# Patient Record
Sex: Female | Born: 1950 | Race: White | Hispanic: No | Smoking: Former smoker
Health system: Southern US, Community
[De-identification: ages and names within clinical notes are randomized; demographics above are authoritative.]

## PROBLEM LIST (undated history)

## (undated) DIAGNOSIS — Z720 Tobacco use: Secondary | ICD-10-CM

## (undated) DIAGNOSIS — F329 Major depressive disorder, single episode, unspecified: Secondary | ICD-10-CM

## (undated) DIAGNOSIS — F32A Depression, unspecified: Secondary | ICD-10-CM

## (undated) DIAGNOSIS — I10 Essential (primary) hypertension: Secondary | ICD-10-CM

## (undated) DIAGNOSIS — E785 Hyperlipidemia, unspecified: Secondary | ICD-10-CM

## (undated) DIAGNOSIS — F419 Anxiety disorder, unspecified: Secondary | ICD-10-CM

## (undated) DIAGNOSIS — I2510 Atherosclerotic heart disease of native coronary artery without angina pectoris: Secondary | ICD-10-CM

---

## 2012-09-21 ENCOUNTER — Other Ambulatory Visit: Payer: Self-pay | Admitting: Family Medicine

## 2012-09-21 ENCOUNTER — Other Ambulatory Visit: Payer: Self-pay

## 2012-09-21 DIAGNOSIS — Z1231 Encounter for screening mammogram for malignant neoplasm of breast: Secondary | ICD-10-CM

## 2012-10-13 ENCOUNTER — Ambulatory Visit
Admission: RE | Admit: 2012-10-13 | Discharge: 2012-10-13 | Disposition: A | Discharge status: Home / Self Care | Payer: BC Managed Care – PPO | Source: Ambulatory Visit | Attending: Family Medicine | Admitting: Family Medicine

## 2012-10-13 DIAGNOSIS — Z1231 Encounter for screening mammogram for malignant neoplasm of breast: Secondary | ICD-10-CM

## 2012-10-16 ENCOUNTER — Other Ambulatory Visit: Payer: Self-pay | Admitting: Family Medicine

## 2012-10-16 DIAGNOSIS — R928 Other abnormal and inconclusive findings on diagnostic imaging of breast: Secondary | ICD-10-CM

## 2012-10-31 ENCOUNTER — Ambulatory Visit
Admission: RE | Admit: 2012-10-31 | Discharge: 2012-10-31 | Disposition: A | Discharge status: Home / Self Care | Payer: BC Managed Care – PPO | Source: Ambulatory Visit | Attending: Family Medicine | Admitting: Family Medicine

## 2012-10-31 DIAGNOSIS — R928 Other abnormal and inconclusive findings on diagnostic imaging of breast: Secondary | ICD-10-CM

## 2013-05-13 ENCOUNTER — Other Ambulatory Visit: Payer: Self-pay

## 2013-05-13 ENCOUNTER — Inpatient Hospital Stay (HOSPITAL_COMMUNITY)
Admission: EM | Admit: 2013-05-13 | Discharge: 2013-05-16 | DRG: 247 | Disposition: A | Discharge status: Home / Self Care | Payer: BC Managed Care – PPO | Source: Other Acute Inpatient Hospital | Attending: Interventional Cardiology | Admitting: Interventional Cardiology

## 2013-05-13 ENCOUNTER — Encounter (HOSPITAL_COMMUNITY)
Admission: EM | Disposition: A | Discharge status: Home / Self Care | Payer: BC Managed Care – PPO | Source: Other Acute Inpatient Hospital | Attending: Interventional Cardiology

## 2013-05-13 DIAGNOSIS — I959 Hypotension, unspecified: Secondary | ICD-10-CM

## 2013-05-13 DIAGNOSIS — Z955 Presence of coronary angioplasty implant and graft: Secondary | ICD-10-CM

## 2013-05-13 DIAGNOSIS — I2119 ST elevation (STEMI) myocardial infarction involving other coronary artery of inferior wall: Principal | ICD-10-CM

## 2013-05-13 DIAGNOSIS — F17210 Nicotine dependence, cigarettes, uncomplicated: Secondary | ICD-10-CM

## 2013-05-13 DIAGNOSIS — F17200 Nicotine dependence, unspecified, uncomplicated: Secondary | ICD-10-CM | POA: Diagnosis present

## 2013-05-13 DIAGNOSIS — I10 Essential (primary) hypertension: Secondary | ICD-10-CM | POA: Diagnosis present

## 2013-05-13 DIAGNOSIS — F411 Generalized anxiety disorder: Secondary | ICD-10-CM | POA: Diagnosis present

## 2013-05-13 DIAGNOSIS — F3289 Other specified depressive episodes: Secondary | ICD-10-CM | POA: Diagnosis present

## 2013-05-13 DIAGNOSIS — Z720 Tobacco use: Secondary | ICD-10-CM

## 2013-05-13 DIAGNOSIS — F329 Major depressive disorder, single episode, unspecified: Secondary | ICD-10-CM | POA: Diagnosis present

## 2013-05-13 DIAGNOSIS — I2510 Atherosclerotic heart disease of native coronary artery without angina pectoris: Secondary | ICD-10-CM

## 2013-05-13 DIAGNOSIS — E785 Hyperlipidemia, unspecified: Secondary | ICD-10-CM

## 2013-05-13 HISTORY — DX: Anxiety disorder, unspecified: F41.9

## 2013-05-13 HISTORY — DX: Tobacco use: Z72.0

## 2013-05-13 HISTORY — DX: Atherosclerotic heart disease of native coronary artery without angina pectoris: I25.10

## 2013-05-13 HISTORY — PX: PERCUTANEOUS CORONARY STENT INTERVENTION (PCI-S): SHX5485

## 2013-05-13 HISTORY — DX: Depression, unspecified: F32.A

## 2013-05-13 HISTORY — PX: LEFT HEART CATHETERIZATION WITH CORONARY ANGIOGRAM: SHX5451

## 2013-05-13 HISTORY — DX: Hyperlipidemia, unspecified: E78.5

## 2013-05-13 HISTORY — DX: Major depressive disorder, single episode, unspecified: F32.9

## 2013-05-13 HISTORY — DX: Essential (primary) hypertension: I10

## 2013-05-13 LAB — CBC WITH DIFFERENTIAL/PLATELET
BASOS PCT: 0 % (ref 0–1)
Basophils Absolute: 0 10*3/uL (ref 0.0–0.1)
EOS ABS: 0 10*3/uL (ref 0.0–0.7)
EOS PCT: 0 % (ref 0–5)
HEMATOCRIT: 37.6 % (ref 36.0–46.0)
HEMOGLOBIN: 12.8 g/dL (ref 12.0–15.0)
Lymphocytes Relative: 10 % — ABNORMAL LOW (ref 12–46)
Lymphs Abs: 1.2 10*3/uL (ref 0.7–4.0)
MCH: 32.1 pg (ref 26.0–34.0)
MCHC: 34 g/dL (ref 30.0–36.0)
MCV: 94.2 fL (ref 78.0–100.0)
MONO ABS: 1.2 10*3/uL — AB (ref 0.1–1.0)
MONOS PCT: 10 % (ref 3–12)
Neutro Abs: 10.2 10*3/uL — ABNORMAL HIGH (ref 1.7–7.7)
Neutrophils Relative %: 80 % — ABNORMAL HIGH (ref 43–77)
Platelets: 181 10*3/uL (ref 150–400)
RBC: 3.99 MIL/uL (ref 3.87–5.11)
RDW: 12.5 % (ref 11.5–15.5)
WBC: 12.6 10*3/uL — ABNORMAL HIGH (ref 4.0–10.5)

## 2013-05-13 LAB — MRSA PCR SCREENING: MRSA BY PCR: NEGATIVE

## 2013-05-13 LAB — COMPREHENSIVE METABOLIC PANEL
ALBUMIN: 3.5 g/dL (ref 3.5–5.2)
ALT: 26 U/L (ref 0–35)
AST: 50 U/L — ABNORMAL HIGH (ref 0–37)
Alkaline Phosphatase: 96 U/L (ref 39–117)
BUN: 16 mg/dL (ref 6–23)
CALCIUM: 8.5 mg/dL (ref 8.4–10.5)
CO2: 21 mEq/L (ref 19–32)
CREATININE: 0.7 mg/dL (ref 0.50–1.10)
Chloride: 108 mEq/L (ref 96–112)
GFR calc Af Amer: >90 mL/min (ref >90)
GFR calc non Af Amer: >90 mL/min (ref >90)
Glucose, Bld: 127 mg/dL — ABNORMAL HIGH (ref 70–99)
Potassium: 4 mEq/L (ref 3.7–5.3)
Sodium: 141 mEq/L (ref 137–147)
TOTAL PROTEIN: 6.3 g/dL (ref 6.0–8.3)
Total Bilirubin: 0.2 mg/dL — ABNORMAL LOW (ref 0.3–1.2)

## 2013-05-13 LAB — TSH: TSH: 0.845 u[IU]/mL (ref 0.350–4.500)

## 2013-05-13 LAB — PRO B NATRIURETIC PEPTIDE: PRO B NATRI PEPTIDE: 166.8 pg/mL — AB (ref 0–125)

## 2013-05-13 LAB — TROPONIN I: Troponin I: 7.1 ng/mL (ref <0.30)

## 2013-05-13 SURGERY — LEFT HEART CATHETERIZATION WITH CORONARY ANGIOGRAM

## 2013-05-13 MED ORDER — NITROGLYCERIN 0.4 MG SL SUBL: 0.4000 mg | SUBLINGUAL_TABLET | SUBLINGUAL | Status: DC | PRN

## 2013-05-13 MED ORDER — SODIUM CHLORIDE 0.9 % IV SOLN
INTRAVENOUS | Status: AC
  Administered 2013-05-13: 125 mL/h via INTRAVENOUS

## 2013-05-13 MED ORDER — ASPIRIN EC 81 MG PO TBEC
81.0000 mg | DELAYED_RELEASE_TABLET | Freq: Every day | ORAL | Status: DC
  Administered 2013-05-14 – 2013-05-16 (×3): 81 mg via ORAL
  Filled 2013-05-13 (×3): qty 1

## 2013-05-13 MED ORDER — TICAGRELOR 90 MG PO TABS
90.0000 mg | ORAL_TABLET | Freq: Two times a day (BID) | ORAL | Status: DC
  Administered 2013-05-14 – 2013-05-16 (×5): 90 mg via ORAL
  Filled 2013-05-13 (×6): qty 1

## 2013-05-13 MED ORDER — ONDANSETRON HCL 4 MG/2ML IJ SOLN
INTRAMUSCULAR | Status: AC
  Administered 2013-05-13: 4 mg
  Filled 2013-05-13: qty 2

## 2013-05-13 MED ORDER — SODIUM CHLORIDE 0.9 % IV SOLN: 0.2500 mg/kg/h | INTRAVENOUS | Status: AC

## 2013-05-13 MED ORDER — SODIUM CHLORIDE 0.9 % IV SOLN: 12.5000 mg | Freq: Once | INTRAVENOUS | Status: DC

## 2013-05-13 MED ORDER — DULOXETINE HCL 30 MG PO CPEP
30.0000 mg | ORAL_CAPSULE | Freq: Every day | ORAL | Status: DC
  Administered 2013-05-14 – 2013-05-16 (×3): 30 mg via ORAL
  Filled 2013-05-13 (×3): qty 1

## 2013-05-13 MED ORDER — DOPAMINE-DEXTROSE 3.2-5 MG/ML-% IV SOLN
5.0000 ug/kg/min | INTRAVENOUS | Status: DC
  Administered 2013-05-13: 5 ug/kg/min via INTRAVENOUS

## 2013-05-13 MED ORDER — ONDANSETRON HCL 4 MG/2ML IJ SOLN
4.0000 mg | Freq: Four times a day (QID) | INTRAMUSCULAR | Status: DC | PRN
  Administered 2013-05-14: 4 mg via INTRAVENOUS
  Filled 2013-05-13: qty 2

## 2013-05-13 MED ORDER — ENOXAPARIN SODIUM 40 MG/0.4ML ~~LOC~~ SOLN
40.0000 mg | SUBCUTANEOUS | Status: DC
  Administered 2013-05-14 – 2013-05-15 (×2): 40 mg via SUBCUTANEOUS
  Filled 2013-05-13 (×4): qty 0.4

## 2013-05-13 MED ORDER — PROMETHAZINE HCL 25 MG/ML IJ SOLN
12.5000 mg | Freq: Once | INTRAMUSCULAR | Status: AC
  Administered 2013-05-13: 12.5 mg via INTRAVENOUS
  Filled 2013-05-13: qty 1

## 2013-05-13 MED ORDER — SIMVASTATIN 40 MG PO TABS
40.0000 mg | ORAL_TABLET | Freq: Every day | ORAL | Status: DC
  Administered 2013-05-13 – 2013-05-15 (×3): 40 mg via ORAL
  Filled 2013-05-13 (×5): qty 1

## 2013-05-13 NOTE — H&P (Addendum)
Kristin Trujillo is a 63 y.o. female  Admit Date: 05/13/2013 Referring Physician: [facility] Primary Cardiologist:: H. Therese Sarah Leia Alf, M.D. Chief complaint / reason for admission: Inferior ST elevation MI  HPI: 63 year old female who was brought to the emergency room by the [facility] N/A his therapy stay. The complaint is the onset of severe chest pressure at 1530 o'clock. She had been raking leaves and working in the yard all weekend. The discomfort has been unremitting since its onset. EKGs from to feel demonstrated inferior ST elevation compatible with inferior STEMI. In the emergency room the patient was difficult to obtain a history from because she was so anxious and concerned that she may die. Her history and says that over the past year she has lost both her husband and son around this time last year and felt that she would leave her 2 remaining sons without a mother. She has no prior history of heart disease. She smokes cigarettes. There is no history of diabetes    PMH:   No past medical history on file.  PSH:   No past surgical history on file. ALLERGIES:   Review of patient's allergies indicates no known allergies. Prior to Admit Meds:   Prescriptions prior to admission  Medication Sig Dispense Refill  . DULoxetine (CYMBALTA) 30 MG capsule Take 30 mg by mouth daily.       Family HX:   No family history on file. Social HX:    History   Social History  . Marital Status: Single    Spouse Name: N/A    Number of Children: N/A  . Years of Education: N/A   Occupational History  . Not on file.   Social History Main Topics  . Smoking status: Not on file  . Smokeless tobacco: Not on file  . Alcohol Use: Not on file  . Drug Use: Not on file  . Sexual Activity: Not on file   Other Topics Concern  . Not on file   Social History Narrative  . No narrative on file     ROS No history of asthma. Cigarette smoker. No history of diabetes. She  denies hypercholesterolemia or therapy for hypercholesterolemia. She has been depressed and anxious since the death of her husband and a son approximately one year ago.  Physical Exam: Height 5\' 3"  (1.6 m), weight 111 lb (50.349 kg). BP 90/60 mmHg , heart rate 60, respirations 22  Patient is very anxious looking and hyperventilating. She appears older than her stated age. The neck exam reveals no thyroid enlargement, significant JVD, or carotid bruit.  The lungs are clear anteriorly and posteriorly. Cardiac exam reveals no rub, click, murmur, or gallop. The extremities reveal no edema. Radial, femoral, and posterior tibial pulses are 2+ and symmetric Abdomen is soft. Bowel sounds are normal. Neurological exam is unremarkable.  Labs: Pending   Radiology:  Pending  EKG:  Inferior ST elevation with reciprocal lateral ST segment depression  ASSESSMENT: 1. Acute inferior wall ST segment elevation myocardial infarction 2. Tobacco smoking history 3. Anxiety and depression  Plan:  1. Patient was brought immediately to the catheterization laboratory where catheterization and possible PCI will be performed. The procedure and risks of stroke, death, myocardial infarction, limb ischemia, bleeding, among other complications were discussed in detail and except above the patient in the emergency circumstances.  Complex decision making including intravenous drips, continuous monitoring of vital signs, and medication administration to control symptoms and vital signs occurred  continuously from the emergency room to the catheterization laboratory. The period of stabilization required prior to starting the procedure was greater than 35 minutes. Lyn RecordsHenry W Vianney Kopecky III 05/13/2013 7:00 PM

## 2013-05-13 NOTE — CV Procedure (Signed)
Left Heart Catheterization with Coronary Angiography and PCI Report  Kristin Trujillo  63 y.o.  female 04/24/1950  Procedure Date: 05/13/2013 Referring Physician: [facility]  Primary Cardiologist: Cherrie GauzeH. WB Leia AlfSmith, III, M.D.  INDICATIONS: Acute inferior wall ST elevation MI commencing at 15:30  PROCEDURE: 1. Left heart catheterization; 2. Coronary angiography; 3. Left ventriculography; 4. DES distal RCA  CONSENT:  The risks, benefits, and details of the procedure were explained in detail to the patient. Risks including death, stroke, heart attack, kidney injury, allergy, limb ischemia, bleeding and radiation injury were discussed.  The patient verbalized understanding and wanted to proceed.  Informed written consent was obtained.  PROCEDURE TECHNIQUE:  After Xylocaine anesthesia a 5 French Slender sheath was placed in the right radial artery with an angiocath and the modified Seldinger technique.  Coronary angiography was done using a 5 F JL 3.5 diagnostic and 6 [place]French JR 4 guide catheter.  Left ventriculography was done using the JR 4 guide catheter and hand injection.   At the time that the first images were obtained, the distal right coronary was identified to have a high-grade thrombus containing stenosis before the origin of the PDA. TIMI grade 3 flow was present. The patient had ongoing chest discomfort. The right coronary images for the first images obtained. Because the vessel was open and she had ongoing chest pain we decided to image the left coronary before proceeding with PCI on what we felt was the culprit lesion. The left coronary system revealed diffuse irregularity but no high-grade obstruction. Hand injection was performed in the left ventricle use of the guide catheter and demonstrated mild basal hypokinesis inferiorly.  Using a JR4,  6 [place]French guide catheter, we performed PCI using a 0.014 Pro-water wire and a 2.0 x 12 mm long Emerge balloon to predilate. We then  positioned and deployed a 2.25 x 18 mm long Xience Alpine to 14 atmospheres. Post-dilatation was performed using a 2.5 x 12 mm Eldridge Euphora. 3 balloon inflations to 12 atmospheres were performed.  The patient was loaded with Brilinta 180 mg orally. Angiomax bolus and infusion was also started prior to PCI. A CT was documented greater than 300.  At completion of the case, a wrist band was applied at 12 cc with good hemostasis   CONTRAST:  Total of 175 cc.  COMPLICATIONS:  Significant hypotension requiring IV dopamine and saline infusion.   HEMODYNAMICS:  Aortic pressure 94/42 mm mercury; LV pressure 100/17 mmHg; LVEDP 20 mm mercury  ANGIOGRAPHIC DATA:   The left main coronary artery is patent.  The left anterior descending artery is transapical. Multiple luminal irregularities are noted. A large diagonal arises from the mid vessel. Significant vasoconstriction is noted..  The left circumflex artery is patent. It gives origin to 2 obtuse marginal branches. Diffuse luminal irregularities are noted..  The right coronary artery is moderately to severely diffusely disease starting proximally. The mid vessel contains segmental eccentric 50-60% narrowing. After a large distal acute marginal branch the RCA contains 99% stenosis, with superimposed thrombus, before the origin of the PDA. The PDA and 2 small left ventricular branches are widely patent.Marland Kitchen.   PCI RESULTS: Distal RCA 99% thrombus containing lesion reduced to 0% with TIMI grade 3 flow after angioplasty, followed by stenting using a 2.25 x 18 mm Xiance Alpine DES, post dilated with a 12 mm x 2.5 Kittrell Euphora. 3 balloon inflations were performed.   LEFT VENTRICULOGRAM:  Left ventricular angiogram was done in the 30  RAO projection and revealed overall normal to hyperdynamic contractility with an estimated ejection fraction of 65-70%. There was basal mild inferior hypokinesis per   IMPRESSIONS:  1. Acute inferior ST elevation myocardial infarction  with spontaneous reperfusion in the distal right coronary by the time the first angiographic image was obtained.  2. Successful angioplasty and stenting of the distal RCA with a 2.25 x 18 mm Alpine drug-eluting stent postdilated to 2.5 mm. The right coronary contains rather diffuse irregularities from proximal to distal vessel.  3. Widely patent LAD, left main, and circumflex arteries but with diffuse luminal irregularities .  4. Overall normal LV function with inferobasal hypokinesis.   RECOMMENDATION:   Aspirin and Brilinta for one year Bivalirudin infusion for 6 hours Wean and DC dopamine as tolerated by blood pressure which must be maintained greater than 90 mm mercury systolic ACE inhibitor therapy. Possible beta blocker therapy if blood pressure allows but at this time patient cannot receive any medication that would tend to lower her blood pressure. Phase I into cardiac rehabilitation The patient will be a candidate for discharge around 48 hours from  Completion of this procedure. Her myocardial infarction should be small.  Marland Kitchen

## 2013-05-14 ENCOUNTER — Encounter (HOSPITAL_COMMUNITY): Payer: Self-pay

## 2013-05-14 DIAGNOSIS — I369 Nonrheumatic tricuspid valve disorder, unspecified: Secondary | ICD-10-CM

## 2013-05-14 DIAGNOSIS — F17210 Nicotine dependence, cigarettes, uncomplicated: Secondary | ICD-10-CM

## 2013-05-14 DIAGNOSIS — E785 Hyperlipidemia, unspecified: Secondary | ICD-10-CM

## 2013-05-14 DIAGNOSIS — Z720 Tobacco use: Secondary | ICD-10-CM

## 2013-05-14 DIAGNOSIS — I2119 ST elevation (STEMI) myocardial infarction involving other coronary artery of inferior wall: Secondary | ICD-10-CM

## 2013-05-14 DIAGNOSIS — F17200 Nicotine dependence, unspecified, uncomplicated: Secondary | ICD-10-CM

## 2013-05-14 LAB — BASIC METABOLIC PANEL
BUN: 13 mg/dL (ref 6–23)
CALCIUM: 8.9 mg/dL (ref 8.4–10.5)
CO2: 22 meq/L (ref 19–32)
CREATININE: 0.63 mg/dL (ref 0.50–1.10)
Chloride: 106 mEq/L (ref 96–112)
GFR calc Af Amer: >90 mL/min (ref >90)
GFR calc non Af Amer: >90 mL/min (ref >90)
Glucose, Bld: 128 mg/dL — ABNORMAL HIGH (ref 70–99)
Potassium: 4 mEq/L (ref 3.7–5.3)
Sodium: 143 mEq/L (ref 137–147)

## 2013-05-14 LAB — CBC
HCT: 37.3 % (ref 36.0–46.0)
Hemoglobin: 12.7 g/dL (ref 12.0–15.0)
MCH: 31.8 pg (ref 26.0–34.0)
MCHC: 34 g/dL (ref 30.0–36.0)
MCV: 93.5 fL (ref 78.0–100.0)
PLATELETS: 171 10*3/uL (ref 150–400)
RBC: 3.99 MIL/uL (ref 3.87–5.11)
RDW: 12.4 % (ref 11.5–15.5)
WBC: 9.7 10*3/uL (ref 4.0–10.5)

## 2013-05-14 LAB — POCT I-STAT, CHEM 8
BUN: 16 mg/dL (ref 6–23)
CHLORIDE: 108 meq/L (ref 96–112)
CREATININE: 0.7 mg/dL (ref 0.50–1.10)
Calcium, Ion: 1.2 mmol/L (ref 1.13–1.30)
GLUCOSE: 156 mg/dL — AB (ref 70–99)
HCT: 34 % — ABNORMAL LOW (ref 36.0–46.0)
HEMOGLOBIN: 11.6 g/dL — AB (ref 12.0–15.0)
Potassium: 3.3 mEq/L — ABNORMAL LOW (ref 3.7–5.3)
SODIUM: 141 meq/L (ref 137–147)
TCO2: 23 mmol/L (ref 0–100)

## 2013-05-14 LAB — TROPONIN I
Troponin I: 19.2 ng/mL (ref <0.30)
Troponin I: >20 ng/mL (ref <0.30)

## 2013-05-14 LAB — LIPID PANEL
CHOLESTEROL: 226 mg/dL — AB (ref 0–200)
HDL: 53 mg/dL (ref >39)
LDL Cholesterol: 160 mg/dL — ABNORMAL HIGH (ref 0–99)
TRIGLYCERIDES: 67 mg/dL (ref <150)
Total CHOL/HDL Ratio: 4.3 RATIO
VLDL: 13 mg/dL (ref 0–40)

## 2013-05-14 LAB — HEMOGLOBIN A1C
Hgb A1c MFr Bld: 5.5 % (ref <5.7)
Mean Plasma Glucose: 111 mg/dL (ref <117)

## 2013-05-14 LAB — POCT ACTIVATED CLOTTING TIME: Activated Clotting Time: 343 seconds

## 2013-05-14 LAB — T4, FREE: FREE T4: 1 ng/dL (ref 0.80–1.80)

## 2013-05-14 MED ORDER — LISINOPRIL 2.5 MG PO TABS
2.5000 mg | ORAL_TABLET | Freq: Every day | ORAL | Status: DC
  Administered 2013-05-16: 2.5 mg via ORAL
  Filled 2013-05-14 (×3): qty 1

## 2013-05-14 MED ORDER — METOPROLOL TARTRATE 12.5 MG HALF TABLET
12.5000 mg | ORAL_TABLET | Freq: Two times a day (BID) | ORAL | Status: DC
  Administered 2013-05-14 – 2013-05-16 (×3): 12.5 mg via ORAL
  Filled 2013-05-14 (×6): qty 1

## 2013-05-14 MED FILL — Ticagrelor Tab 90 MG: ORAL | Qty: 1 | Status: AC

## 2013-05-14 MED FILL — Fentanyl Citrate Inj 0.05 MG/ML: INTRAMUSCULAR | Qty: 2 | Status: AC

## 2013-05-14 MED FILL — Midazolam HCl Inj 2 MG/2ML (Base Equivalent): INTRAMUSCULAR | Qty: 2 | Status: AC

## 2013-05-14 MED FILL — Bivalirudin Trifluoroacetate For IV Soln 250 MG (Base Equiv): INTRAVENOUS | Qty: 250 | Status: AC

## 2013-05-14 MED FILL — Lidocaine HCl Local Preservative Free (PF) Inj 1%: INTRAMUSCULAR | Qty: 30 | Status: AC

## 2013-05-14 MED FILL — Verapamil HCl IV Soln 2.5 MG/ML: INTRAVENOUS | Qty: 2 | Status: AC

## 2013-05-14 MED FILL — Ondansetron HCl Inj 4 MG/2ML (2 MG/ML): INTRAMUSCULAR | Qty: 2 | Status: AC

## 2013-05-14 MED FILL — Sodium Chloride IV Soln 0.9%: INTRAVENOUS | Qty: 50 | Status: AC

## 2013-05-14 MED FILL — Dopamine in Dextrose 5% Inj 3.2 MG/ML: INTRAVENOUS | Qty: 250 | Status: AC

## 2013-05-14 MED FILL — Nitroglycerin IV Soln 200 MCG/ML in D5W: INTRAVENOUS | Qty: 1 | Status: AC

## 2013-05-14 MED FILL — Heparin Sodium (Porcine) 2 Unit/ML in Sodium Chloride 0.9%: INTRAMUSCULAR | Qty: 1500 | Status: AC

## 2013-05-14 NOTE — Progress Notes (Signed)
  Echocardiogram 2D Echocardiogram has been performed.  Kristin Trujillo 05/14/2013, 2:46 PM

## 2013-05-14 NOTE — Progress Notes (Signed)
CARDIAC REHAB PHASE I   PRE:  Rate/Rhythm: 72 SR    BP: sitting 78/49, standing 66/46 then 81/55    SaO2:   MODE:  Ambulation: 350 ft   POST:  Rate/Rhythm: 89 SR    BP: sitting 73/51     SaO2:   Pt BP running about 78 systolic. RN turned dopamine back on and I discussed MI ed for a few minutes. BP still low after 10 min. Pt asymptomatic, even with standing and walking. No c/o, feels good. Pt tearful when discussing her family and this event. Sts she doesn't like to take meds. Discussed importance of Brilinta. Will f/u for more ed. [id number]   Kristin Trujillo CES, ACSM 05/14/2013 10:41 AM

## 2013-05-14 NOTE — Progress Notes (Signed)
   Subjective:  Day 1 s/p Inferior STEMI; no further chest pain  Objective:   Vital Signs in the last 24 hours: Temp:  [97.6 F (36.4 C)-97.9 F (36.6 C)] 97.9 F (36.6 C) (04/13 0400) Pulse Rate:  [63-82] 75 (04/13 0400) Resp:  [9-22] 9 (04/13 0400) BP: (78-140)/(48-71) 109/56 mmHg (04/13 0400) SpO2:  [92 %-98 %] 98 % (04/13 0400) FiO2 (%):  [28 %] 28 % (04/12 1948) Weight:  [111 lb (50.349 kg)] 111 lb (50.349 kg) (04/12 1700)  Intake/Output from previous day: 04/12 0701 - 04/13 0700 In: 1306.2 [P.O.:240; I.V.:1066.2] Out: 2775 [Urine:2775]  Medications: . aspirin EC  81 mg Oral Daily  . DULoxetine  30 mg Oral Daily  . enoxaparin (LOVENOX) injection  40 mg Subcutaneous Q24H  . simvastatin  40 mg Oral q1800  . Ticagrelor  90 mg Oral BID    . DOPamine 5 mcg/kg/min (05/13/13 1948)    Physical Exam:   General appearance: alert, cooperative and appears older than stated age Neck: no adenopathy, no carotid bruit, no JVD, supple, symmetrical, trachea midline and thyroid not enlarged, symmetric, no tenderness/mass/nodules Lungs: decreased BS no wheezing Heart: regular rate and rhythm and 1/6 sem Abdomen: soft, non-tender; bowel sounds normal; no masses,  no organomegaly Extremities: no edema, redness or tenderness in the calves or thighs Pulses: 2+ and symmetric; R radial site stable Skin: Skin color, texture, turgor normal. No rashes or lesions Neurologic: Grossly normal   Rate: 88  Rhythm: normal sinus rhythm  ECG: Probable low atrial rhythm at 75, early transition   Lab Results:     Recent Labs  05/13/13 2050 05/14/13 0120  NA 141 143  K 4.0 4.0  CL 108 106  CO2 21 22  GLUCOSE 127* 128*  BUN 16 13  CREATININE 0.70 0.63    Recent Labs  05/13/13 2050 05/14/13 0120  TROPONINI 7.10* 19.20*   Hepatic Function Panel  Recent Labs  05/13/13 2050  PROT 6.3  ALBUMIN 3.5  AST 50*  ALT 26  ALKPHOS 96  BILITOT 0.2*   No results found for this  basename: INR,  in the last 72 hours BNP (last 3 results)  Recent Labs  05/13/13 2050  PROBNP 166.8*    Lipid Panel     Component Value Date/Time   CHOL 226* 05/14/2013 0120   TRIG 67 05/14/2013 0120   HDL 53 05/14/2013 0120   CHOLHDL 4.3 05/14/2013 0120   VLDL 13 05/14/2013 0120   LDLCALC 160* 05/14/2013 0120      Imaging:  No results found.    Assessment/Plan:   Active Problems:   ST elevation myocardial infarction (STEMI) of inferior wall   Hyperlipidemia LDL goal < 70   Tobacco abuse  Day 1 s/p IMI and PCI to subtotal RCA stenosis with DES stent. Trop increased to 19.2.  Will check echo today. Off dopamine with BP 110 systolic.  Add low dose cardioselective BB and ACE-I. Now on statin; was not on PTA. Discussed smoking cessation. Will ambulate this am and if stable transfer to telemetry later today with DC next 24 - 48 hrs depending on status.     Lennette Biharihomas A. Mckenlee Mangham, MD, [facility]villeFACC 05/14/2013, 8:18 AM

## 2013-05-14 NOTE — Care Management Note (Addendum)
    Page 1 of 1   05/14/2013     11:27:26 AM   CARE MANAGEMENT NOTE 05/14/2013  Patient:  Kristin Trujillo,Kristin Trujillo   Account Number:  [id number]  Date Initiated:  05/14/2013  Documentation initiated by:  Junius CreamerWELL,Kristin  Subjective/Objective Assessment:   adm w mi     Action/Plan:   lives alone   Anticipated DC Date:     Anticipated DC Plan:        DC Planning Services  CM consult  Medication Assistance      Choice offered to / List presented to:             Status of service:   Medicare Important Message given?   (If response is "NO", the following Medicare IM given date fields will be blank) Date Medicare IM given:   Date Additional Medicare IM given:    Discharge Disposition:    Per UR Regulation:  Reviewed for med. necessity/level of care/duration of stay  If discussed at Long Length of Stay Meetings, dates discussed:    Comments:  4/13 1121 Kristin Khyson Sebesta rn,bsn gave pt 30day free and copay assist card for brilinta. pt must meet out of pocket amt then pays 20% of med. out of pocket is 2,000.00.

## 2013-05-15 DIAGNOSIS — I959 Hypotension, unspecified: Secondary | ICD-10-CM

## 2013-05-15 NOTE — Progress Notes (Signed)
CARDIAC REHAB PHASE I   PRE:  Rate/Rhythm: 67 SR  BP:  Supine:   Sitting: 110/61  Standing:    SaO2:   MODE:  Ambulation: 700 ft   POST:  Rate/Rhythm: 80 SR  BP:  Supine:   Sitting: 108/57  Standing:    SaO2:  0945-1034 Pt walked 700 ft with minimal asst without CP. Tolerated well. BP higher today. MI education completed today. Pt voiced understanding. Discussed smoking cessation and pt has handouts. Encouraged her to call 1800quitnow as needed. Pt stated that she has been under a great deal of stress with the deaths of her son and husband to cancer. She said she felt that she used the smoking to help with stress. Not sure pt will be able to quit. Stated doing well without nicotine patches now. Has brilinta packet and stent card. Reviewed importance of brilinta and how to take NTG if needed. Discussed CRP 2 and pt [facility]. Pt tearful during ed as she talks about how hard it is to live alone.   Luetta Nuttingharlene Angell Honse, RN BSN  05/15/2013 10:29 AM

## 2013-05-15 NOTE — Progress Notes (Addendum)
Subjective:  No recurrent chest pain; became hypotensive yesterday and stayed in unit.  Objective:   Vital Signs in the last 24 hours: Temp:  [97.9 F (36.6 C)-98.9 F (37.2 C)] 97.9 F (36.6 C) (04/14 0731) Resp:  [11-17] 17 (04/13 1600) BP: (77-106)/(39-71) 88/56 mmHg (04/14 0731) SpO2:  [99 %-100 %] 100 % (04/14 0731)  Intake/Output from previous day: 04/13 0701 - 04/14 0700 In: 720 [P.O.:720] Out: -   Medications: . aspirin EC  81 mg Oral Daily  . DULoxetine  30 mg Oral Daily  . enoxaparin (LOVENOX) injection  40 mg Subcutaneous Q24H  . lisinopril  2.5 mg Oral Daily  . metoprolol tartrate  12.5 mg Oral BID  . simvastatin  40 mg Oral q1800  . Ticagrelor  90 mg Oral BID    . DOPamine 5 mcg/kg/min (05/13/13 1948)    Physical Exam:   General appearance: appears older than stated age and no distress Neck: no JVD, supple, symmetrical, trachea midline and thyroid not enlarged, symmetric, no tenderness/mass/nodules Lungs: clear to auscultation bilaterally Heart: regular rate and rhythm and 1/6 sem Abdomen: soft, non-tender; bowel sounds normal; no masses,  no organomegaly Extremities: no edema, redness or tenderness in the calves or thighs Pulses: 2+ and symmetric Skin: Skin color, texture, turgor normal. No rashes or lesions Neurologic: Grossly normal   Rate: 69  Rhythm: normal sinus rhythm  Lab Results:    Recent Labs  05/13/13 2050 05/14/13 0120  NA 141 143  K 4.0 4.0  CL 108 106  CO2 21 22  GLUCOSE 127* 128*  BUN 16 13  CREATININE 0.70 0.63   CBC    Component Value Date/Time   WBC 9.7 05/14/2013 0120   RBC 3.99 05/14/2013 0120   HGB 12.7 05/14/2013 0120   HCT 37.3 05/14/2013 0120   PLT 171 05/14/2013 0120   MCV 93.5 05/14/2013 0120   MCH 31.8 05/14/2013 0120   MCHC 34.0 05/14/2013 0120   RDW 12.4 05/14/2013 0120   LYMPHSABS 1.2 05/13/2013 2050   MONOABS 1.2* 05/13/2013 2050   EOSABS 0.0 05/13/2013 2050   BASOSABS 0.0 05/13/2013 2050     Recent  Labs  05/14/13 0120 05/14/13 0912  TROPONINI 19.20* >20.00*   Hepatic Function Panel  Recent Labs  05/13/13 2050  PROT 6.3  ALBUMIN 3.5  AST 50*  ALT 26  ALKPHOS 96  BILITOT 0.2*   No results found for this basename: INR,  in the last 72 hours BNP (last 3 results)  Recent Labs  05/13/13 2050  PROBNP 166.8*    Lipid Panel     Component Value Date/Time   CHOL 226* 05/14/2013 0120   TRIG 67 05/14/2013 0120   HDL 53 05/14/2013 0120   CHOLHDL 4.3 05/14/2013 0120   VLDL 13 05/14/2013 0120   LDLCALC 160* 05/14/2013 0120      Imaging:  Study Conclusions  - Left ventricle: The cavity size was normal. Wall thickness was normal. Systolic function was normal. The estimated ejection fraction was in the range of 60% to 65%. Wall motion was normal; there were no regional wall motion abnormalities. Left ventricular diastolic function parameters were normal. - Left atrium: The atrium was normal in size. - Tricuspid valve: Mild regurgitation. - Pulmonary arteries: PA peak pressure: 30mm Hg (S). - Inferior vena cava: The vessel was normal in size; the respirophasic diameter changes were in the normal range (= 50%); findings are consistent with normal central venous pressure. - Pericardium, extracardiac: There  was no pericardial effusion.    Assessment/Plan:   Active Problems:   ST elevation myocardial infarction (STEMI) of inferior wall   Hyperlipidemia LDL goal < 70   Tobacco abuse  Day 2 s/p IMI. Hypotensive yesterday. Now off dopamine. Trop > 20.  AST mildly increased may be secondary to MI rather than statin. LDL 160; on simvastatin 40 mg. Transfer to telemetry. Ambulate; plan dc tomorrow. Continue low dose BB and ACE-I if BP allows.  F/U ECG. Smoking cessation.    Lennette Biharihomas A. Zana Biancardi, MD, [facility] InstituteFACC 05/15/2013, 8:03 AM

## 2013-05-16 ENCOUNTER — Encounter (HOSPITAL_COMMUNITY): Payer: Self-pay | Admitting: Physician Assistant

## 2013-05-16 DIAGNOSIS — I10 Essential (primary) hypertension: Secondary | ICD-10-CM | POA: Diagnosis present

## 2013-05-16 DIAGNOSIS — I2510 Atherosclerotic heart disease of native coronary artery without angina pectoris: Secondary | ICD-10-CM | POA: Diagnosis present

## 2013-05-16 MED ORDER — ASPIRIN 81 MG PO TBEC
81.0000 mg | DELAYED_RELEASE_TABLET | Freq: Every day | ORAL | Status: AC
Start: 2013-05-16 — End: ?

## 2013-05-16 MED ORDER — TICAGRELOR 90 MG PO TABS: 90.0000 mg | ORAL_TABLET | Freq: Two times a day (BID) | ORAL | Status: DC

## 2013-05-16 MED ORDER — NITROGLYCERIN 0.4 MG SL SUBL: 0.4000 mg | SUBLINGUAL_TABLET | SUBLINGUAL | Status: DC | PRN

## 2013-05-16 MED ORDER — LISINOPRIL 2.5 MG PO TABS: 2.5000 mg | ORAL_TABLET | Freq: Every day | ORAL | Status: DC

## 2013-05-16 MED ORDER — SIMVASTATIN 40 MG PO TABS: 40.0000 mg | ORAL_TABLET | Freq: Every day | ORAL | Status: DC

## 2013-05-16 MED ORDER — METOPROLOL TARTRATE 12.5 MG HALF TABLET: 12.5000 mg | ORAL_TABLET | Freq: Two times a day (BID) | ORAL | Status: DC

## 2013-05-16 NOTE — Discharge Summary (Signed)
Discharge Summary   Patient ID: Kristin OliveLydia A Daubenspeck MRN: [id number], DOB/AGE: 07/09/1950 63 y.o. Admit date: 05/13/2013 D/C date:     05/16/2013  Primary Cardiologist: Dr. Ival BibleSmtih  Active Problems:   ST elevation myocardial infarction (STEMI) of inferior wall   Hyperlipidemia LDL goal < 70   Tobacco abuse   HTN (hypertension)   CAD (coronary artery disease)   Admission Dates: 05/13/13-05/16/13 Discharge Diagnosis: acute inferior STEMI s/p DES to RCA (peak troponin >20)  HPI: Kristin Trujillo is a 63 y.o. female with a history of HLD, HTN, continued tobacco abuse and no past cardiac disease who [facility] with severe chest pressure and was found to have an acute inferior STEMI. She was taken for emergent cardiac catheterization.   Hospital Course: Cardiac catheterization revealed acute inferior STEMI with spontaneous reperfusion in the distal right coronary by the time the first angiographic image was obtained. She underwent successful angioplasty and stenting of the distal RCA with a 2.25 x 18 mm Alpine drug-eluting stent postdilated to 2.5 mm. The right coronary contains rather diffuse irregularities from proximal to distal vessel. She had a widely patent LAD, left main, and circumflex arteries but with diffuse luminal irregularities. She had overall normal LV function with inferobasal hypokinesis. She had some issues with hypotension and was placed on a dopamine gtt, which was eventually tapered off. When her BP stabilized a low dose of BB and ACE were added. She was placed on metoprolol 12.5mg  BID and lisinopril 2.5mg . Simvastatin 40 mg was also added. Her AST was mildly elevated but thought to be secondary to MI rather than statin.  The patient has had an uncomplicated hospital course and is recovering well. The radial catheter site is stable. She has been seen by Dr. Eden EmmsNishan today and deemed ready for discharge home. All follow-up appointments have been scheduled. Smoking cessation was  disscussed in length. A 30 day free supply of Brilinta was provided for the patient. A work excuse note was provided as well. Discharge medications include ASA/Brilinta, lisinopril 2.5mg , metoprolol 12.5 mg, SL NTG, simvastatin 40mg .    Discharge Vitals: Blood pressure 110/70, pulse 62, temperature 98.2 F (36.8 C), temperature source Oral, resp. rate 18, height 5\' 3"  (1.6 m), weight 111 lb 14.1 oz (50.749 kg), SpO2 97.00%.  Labs: Lab Results  Component Value Date   WBC 9.7 05/14/2013   HGB 12.7 05/14/2013   HCT 37.3 05/14/2013   MCV 93.5 05/14/2013   PLT 171 05/14/2013     Recent Labs Lab 05/13/13 2050 05/14/13 0120  NA 141 143  K 4.0 4.0  CL 108 106  CO2 21 22  BUN 16 13  CREATININE 0.70 0.63  CALCIUM 8.5 8.9  PROT 6.3  --   BILITOT 0.2*  --   ALKPHOS 96  --   ALT 26  --   AST 50*  --   GLUCOSE 127* 128*    Recent Labs  05/13/13 2050 05/14/13 0120 05/14/13 0912  TROPONINI 7.10* 19.20* >20.00*   Lab Results  Component Value Date   CHOL 226* 05/14/2013   HDL 53 05/14/2013   LDLCALC 160* 05/14/2013   TRIG 67 05/14/2013    Diagnostic Studies/Procedures   Left Heart Catheterization with Coronary Angiography and PCI Report    Procedure Date: 05/13/2013  [facility]  Primary Cardiologist: Cherrie GauzeH. WB Leia AlfSmith, III, M.D.  INDICATIONS: Acute inferior wall ST elevation MI commencing at 15:30  PROCEDURE: 1. Left heart catheterization; 2.  Coronary angiography; 3. Left ventriculography; 4. DES distal RCA  CONSENT:  The risks, benefits, and details of the procedure were explained in detail to the patient. Risks including death, stroke, heart attack, kidney injury, allergy, limb ischemia, bleeding and radiation injury were discussed. The patient verbalized understanding and wanted to proceed. Informed written consent was obtained.  PROCEDURE TECHNIQUE: After Xylocaine anesthesia a 5 French Slender sheath was placed in the right radial artery with an angiocath  and the modified Seldinger technique. Coronary angiography was done using a 5 F JL 3.5 diagnostic and 6 [place] JR 4 guide catheter. Left ventriculography was done using the JR 4 guide catheter and hand injection.  At the time that the first images were obtained, the distal right coronary was identified to have a high-grade thrombus containing stenosis before the origin of the PDA. TIMI grade 3 flow was present. The patient had ongoing chest discomfort. The right coronary images for the first images obtained. Because the vessel was open and she had ongoing chest pain we decided to image the left coronary before proceeding with PCI on what we felt was the culprit lesion. The left coronary system revealed diffuse irregularity but no high-grade obstruction. Hand injection was performed in the left ventricle use of the guide catheter and demonstrated mild basal hypokinesis inferiorly.  Using a JR4, 6 [place] guide catheter, we performed PCI using a 0.014 Pro-water wire and a 2.0 x 12 mm long Emerge balloon to predilate. We then positioned and deployed a 2.25 x 18 mm long Xience Alpine to 14 atmospheres. Post-dilatation was performed using a 2.5 x 12 mm [place] Euphora. 3 balloon inflations to 12 atmospheres were performed.  The patient was loaded with Brilinta 180 mg orally. Angiomax bolus and infusion was also started prior to PCI. A CT was documented greater than 300.  At completion of the case, a wrist band was applied at 12 cc with good hemostasis  CONTRAST: Total of 175 cc.  COMPLICATIONS: Significant hypotension requiring IV dopamine and saline infusion.  HEMODYNAMICS: Aortic pressure 94/42 mm mercury; LV pressure 100/17 mmHg; LVEDP 20 mm mercury  ANGIOGRAPHIC DATA: The left main coronary artery is patent.  The left anterior descending artery is transapical. Multiple luminal irregularities are noted. A large diagonal arises from the mid vessel. Significant vasoconstriction is noted..  The left circumflex artery  is patent. It gives origin to 2 obtuse marginal branches. Diffuse luminal irregularities are noted..  The right coronary artery is moderately to severely diffusely disease starting proximally. The mid vessel contains segmental eccentric 50-60% narrowing. After a large distal acute marginal branch the RCA contains 99% stenosis, with superimposed thrombus, before the origin of the PDA. The PDA and 2 small left ventricular branches are widely patent.Marland Kitchen  PCI RESULTS: Distal RCA 99% thrombus containing lesion reduced to 0% with TIMI grade 3 flow after angioplasty, followed by stenting using a 2.25 x 18 mm Xiance Alpine DES, post dilated with a 12 mm x 2.5  Euphora. 3 balloon inflations were performed.  LEFT VENTRICULOGRAM: Left ventricular angiogram was done in the 30 RAO projection and revealed overall normal to hyperdynamic contractility with an estimated ejection fraction of 65-70%. There was basal mild inferior hypokinesis per  IMPRESSIONS: 1. Acute inferior ST elevation myocardial infarction with spontaneous reperfusion in the distal right coronary by the time the first angiographic image was obtained.  2. Successful angioplasty and stenting of the distal RCA with a 2.25 x 18 mm Alpine drug-eluting stent postdilated to 2.5 mm.  The right coronary contains rather diffuse irregularities from proximal to distal vessel.  3. Widely patent LAD, left main, and circumflex arteries but with diffuse luminal irregularities .  4. Overall normal LV function with inferobasal hypokinesis.  RECOMMENDATION:  Aspirin and Brilinta for one year  Bivalirudin infusion for 6 hours  Wean and DC dopamine as tolerated by blood pressure which must be maintained greater than 90 mm mercury systolic  ACE inhibitor therapy.  Possible beta blocker therapy if blood pressure allows but at this time patient cannot receive any medication that would tend to lower her blood pressure.  Phase I into cardiac rehabilitation  The patient will  be a candidate for discharge around 48 hours from Completion of this procedure. Her myocardial infarction should be small.  2D ECHO 05/14/13 Study Conclusions - Left ventricle: The cavity size was normal. Wall thickness was normal. Systolic function was normal. The estimated ejection fraction was in the range of 60% to 65%. Wall motion was normal; there were no regional wall motion abnormalities. Left ventricular diastolic function parameters were normal. - Left atrium: The atrium was normal in size. - Tricuspid valve: Mild regurgitation. - Pulmonary arteries: PA peak pressure: 30mm Hg (S). - Inferior vena cava: The vessel was normal in size; the respirophasic diameter changes were in the normal range (= 50%); findings are consistent with normal central venous pressure. - Pericardium, extracardiac: There was no pericardial Effusion.   Discharge Medications     Medication List         aspirin 81 MG EC tablet  Take 1 tablet (81 mg total) by mouth daily.     DULoxetine 30 MG capsule  Commonly known as:  CYMBALTA  Take 30 mg by mouth daily.     lisinopril 2.5 MG tablet  Commonly known as:  PRINIVIL,ZESTRIL  Take 1 tablet (2.5 mg total) by mouth daily.     metoprolol tartrate 12.5 mg Tabs tablet  Commonly known as:  LOPRESSOR  Take 0.5 tablets (12.5 mg total) by mouth 2 (two) times daily.     nitroGLYCERIN 0.4 MG SL tablet  Commonly known as:  NITROSTAT  Place 1 tablet (0.4 mg total) under the tongue every 5 (five) minutes x 3 doses as needed for chest pain.     simvastatin 40 MG tablet  Commonly known as:  ZOCOR  Take 1 tablet (40 mg total) by mouth daily at 6 PM.     Ticagrelor 90 MG Tabs tablet  Commonly known as:  BRILINTA  Take 1 tablet (90 mg total) by mouth 2 (two) times daily.        Disposition   The patient will be discharged in stable condition to home. Discharge Orders   Future Appointments Provider Department Dept Phone   05/28/2013 8:15 AM Tarri AbernethyMichele  M Lilla ShookLenze, PA-C [facility] [address] Office [phone]   06/26/2013 8:00 AM [facility] [place] [facility] [phone]   Future Orders Complete By Expires   Amb Referral to Cardiac Rehabilitation  As directed          Duration of Discharge Encounter: Greater than 30 minutes including physician and PA time.  Signed, Thereasa ParkinKathryn Stern PA-C 05/16/2013, 3:39 PM

## 2013-05-16 NOTE — Discharge Instructions (Signed)

## 2013-05-16 NOTE — Progress Notes (Signed)
Patient ID: Kristin Trujillo, female   DOB: 01/12/1951, 63 y.o.   MRN: [id number]    Subjective:  Denies SSCP, palpitations or Dyspnea Wants to go home   Objective:  Filed Vitals:   05/15/13 0946 05/15/13 1300 05/15/13 2031 05/16/13 0430  BP: 110/61 118/74 125/69 116/59  Pulse:   76 62  Temp:  98.3 F (36.8 C) 97.9 F (36.6 C) 98.2 F (36.8 C)  TempSrc:  Oral Oral Oral  Resp:   18 18  Height:      Weight:    111 lb 14.1 oz (50.749 kg)  SpO2:  94% 97% 97%    Intake/Output from previous day:  Intake/Output Summary (Last 24 hours) at 05/16/13 19140832 Last data filed at 05/15/13 0900  Gross per 24 hour  Intake    240 ml  Output      0 ml  Net    240 ml    Physical Exam: Affect appropriate Healthy:  appears stated age HEENT: normal Neck supple with no adenopathy JVP normal no bruits no thyromegaly Lungs clear with no wheezing and good diaphragmatic motion Heart:  S1/S2 no murmur, no rub, gallop or click PMI normal Abdomen: benighn, BS positve, no tenderness, no AAA no bruit.  No HSM or HJR Distal pulses intact with no bruits No edema Neuro non-focal Skin warm and dry No muscular weakness   Lab Results: Basic Metabolic Panel:  Recent Labs  78/29/5603/01/16 2050 05/14/13 0120  NA 141 143  K 4.0 4.0  CL 108 106  CO2 21 22  GLUCOSE 127* 128*  BUN 16 13  CREATININE 0.70 0.63  CALCIUM 8.5 8.9   Liver Function Tests:  Recent Labs  05/13/13 2050  AST 50*  ALT 26  ALKPHOS 96  BILITOT 0.2*  PROT 6.3  ALBUMIN 3.5   CBC:  Recent Labs  05/13/13 1744 05/13/13 2050 05/14/13 0120  WBC  --  12.6* 9.7  NEUTROABS  --  10.2*  --   HGB 11.6* 12.8 12.7  HCT 34.0* 37.6 37.3  MCV  --  94.2 93.5  PLT  --  181 171   Cardiac Enzymes:  Recent Labs  05/13/13 2050 05/14/13 0120 05/14/13 0912  TROPONINI 7.10* 19.20* >20.00*   Hemoglobin A1C:  Recent Labs  05/13/13 2050  HGBA1C 5.5   Fasting Lipid Panel:  Recent Labs  05/14/13 0120  CHOL 226*  HDL 53    LDLCALC 160*  TRIG 67  CHOLHDL 4.3   Thyroid Function Tests:  Recent Labs  05/13/13 2050  TSH 0.845    Imaging: Imaging results have been reviewed and No results found.  Cardiac Studies:  ECG:  Inferior T wave changes ST elevation resolved    Telemetry: NSR no VT   Echo:   Medications:   . aspirin EC  81 mg Oral Daily  . DULoxetine  30 mg Oral Daily  . enoxaparin (LOVENOX) injection  40 mg Subcutaneous Q24H  . lisinopril  2.5 mg Oral Daily  . metoprolol tartrate  12.5 mg Oral BID  . simvastatin  40 mg Oral q1800  . Ticagrelor  90 mg Oral BID     . DOPamine Stopped (05/15/13 1900)    Assessment/Plan:  IMI:  Improved  Continue DAT and beta blocker D/C home today F/U in office Dr Katrinka BlazingSmith Chol:  Continue statin   Wendall Stadeeter C Shanel Prazak 05/16/2013, 8:32 AM

## 2013-05-28 ENCOUNTER — Ambulatory Visit (INDEPENDENT_AMBULATORY_CARE_PROVIDER_SITE_OTHER): Payer: BC Managed Care – PPO | Admitting: Physician Assistant

## 2013-05-28 ENCOUNTER — Encounter: Payer: Self-pay | Admitting: Physician Assistant

## 2013-05-28 VITALS — BP 120/60 | HR 49 | Ht 63.0 in | Wt 111.0 lb

## 2013-05-28 DIAGNOSIS — I10 Essential (primary) hypertension: Secondary | ICD-10-CM

## 2013-05-28 DIAGNOSIS — E785 Hyperlipidemia, unspecified: Secondary | ICD-10-CM

## 2013-05-28 DIAGNOSIS — F419 Anxiety disorder, unspecified: Secondary | ICD-10-CM | POA: Insufficient documentation

## 2013-05-28 NOTE — Assessment & Plan Note (Signed)
Patient has a fair amount of stress going on in her life. Recommend followup with primary M.D.

## 2013-05-28 NOTE — Patient Instructions (Signed)
Your physician recommends that you schedule a follow-up appointment in: 2 MONTHS WITH DR. Katrinka BlazingSMITH  Your physician recommends that you return for lab work in: LIPID'S (CHOLESTROL) AND LFT'S (LIVER) IN 4 WEEKS  YOUR PROVIDER WOULD LIKE YOU TO START OVER THE COUNTER NICOTINE PATCH   Smoking Cessation Quitting smoking is important to your health and has many advantages. However, it is not always easy to quit since nicotine is a very addictive drug. Often times, people try 3 times or more before being able to quit. This document explains the best ways for you to prepare to quit smoking. Quitting takes hard work and a lot of effort, but you can do it. ADVANTAGES OF QUITTING SMOKING  You will live longer, feel better, and live better.  Your body will feel the impact of quitting smoking almost immediately.  Within 20 minutes, blood pressure decreases. Your pulse returns to its normal level.  After 8 hours, carbon monoxide levels in the blood return to normal. Your oxygen level increases.  After 24 hours, the chance of having a heart attack starts to decrease. Your breath, hair, and body stop smelling like smoke.  After 48 hours, damaged nerve endings begin to recover. Your sense of taste and smell improve.  After 72 hours, the body is virtually free of nicotine. Your bronchial tubes relax and breathing becomes easier.  After 2 to 12 weeks, lungs can hold more air. Exercise becomes easier and circulation improves.  The risk of having a heart attack, stroke, cancer, or lung disease is greatly reduced.  After 1 year, the risk of coronary heart disease is cut in half.  After 5 years, the risk of stroke falls to the same as a nonsmoker.  After 10 years, the risk of lung cancer is cut in half and the risk of other cancers decreases significantly.  After 15 years, the risk of coronary heart disease drops, usually to the level of a nonsmoker.  If you are pregnant, quitting smoking will improve your  chances of having a healthy baby.  The people you live with, especially any children, will be healthier.  You will have extra money to spend on things other than cigarettes. QUESTIONS TO THINK ABOUT BEFORE ATTEMPTING TO QUIT You may want to talk about your answers with your caregiver.  Why do you want to quit?  If you tried to quit in the past, what helped and what did not?  What will be the most difficult situations for you after you quit? How will you plan to handle them?  Who can help you through the tough times? Your family? Friends? A caregiver?  What pleasures do you get from smoking? What ways can you still get pleasure if you quit? Here are some questions to ask your caregiver:  How can you help me to be successful at quitting?  What medicine do you think would be best for me and how should I take it?  What should I do if I need more help?  What is smoking withdrawal like? How can I get information on withdrawal? GET READY  Set a quit date.  Change your environment by getting rid of all cigarettes, ashtrays, matches, and lighters in your home, car, or work. Do not let people smoke in your home.  Review your past attempts to quit. Think about what worked and what did not. GET SUPPORT AND ENCOURAGEMENT You have a better chance of being successful if you have help. You can get support in many  ways.  Tell your family, friends, and co-workers that you are going to quit and need their support. Ask them not to smoke around you.  Get individual, group, or telephone counseling and support. Programs are available at [facility]. Call your local health department for information about programs in your area.  Spiritual beliefs and practices may help some smokers quit.  Download a "quit meter" on your computer to keep track of quit statistics, such as how long you have gone without smoking, cigarettes not smoked, and money saved.  Get a self-help book  about quitting smoking and staying off of tobacco. LEARN NEW SKILLS AND BEHAVIORS  Distract yourself from urges to smoke. Talk to someone, go for a walk, or occupy your time with a task.  Change your normal routine. Take a different route to work. Drink tea instead of coffee. Eat breakfast in a different place.  Reduce your stress. Take a hot bath, exercise, or read a book.  Plan something enjoyable to do every day. Reward yourself for not smoking.  Explore interactive web-based programs that specialize in helping you quit. GET MEDICINE AND USE IT CORRECTLY Medicines can help you stop smoking and decrease the urge to smoke. Combining medicine with the above behavioral methods and support can greatly increase your chances of successfully quitting smoking.  Nicotine replacement therapy helps deliver nicotine to your body without the negative effects and risks of smoking. Nicotine replacement therapy includes nicotine gum, lozenges, inhalers, nasal sprays, and skin patches. Some may be available over-the-counter and others require a prescription.  Antidepressant medicine helps people abstain from smoking, but how this works is unknown. This medicine is available by prescription.  Nicotinic receptor partial agonist medicine simulates the effect of nicotine in your brain. This medicine is available by prescription. Ask your caregiver for advice about which medicines to use and how to use them based on your health history. Your caregiver will tell you what side effects to look out for if you choose to be on a medicine or therapy. Carefully read the information on the package. Do not use any other product containing nicotine while using a nicotine replacement product.  RELAPSE OR DIFFICULT SITUATIONS Most relapses occur within the first 3 months after quitting. Do not be discouraged if you start smoking again. Remember, most people try several times before finally quitting. You may have symptoms of  withdrawal because your body is used to nicotine. You may crave cigarettes, be irritable, feel very hungry, cough often, get headaches, or have difficulty concentrating. The withdrawal symptoms are only temporary. They are strongest when you first quit, but they will go away within 10 14 days. To reduce the chances of relapse, try to:  Avoid drinking alcohol. Drinking lowers your chances of successfully quitting.  Reduce the amount of caffeine you consume. Once you quit smoking, the amount of caffeine in your body increases and can give you symptoms, such as a rapid heartbeat, sweating, and anxiety.  Avoid smokers because they can make you want to smoke.  Do not let weight gain distract you. Many smokers will gain weight when they quit, usually less than 10 pounds. Eat a healthy diet and stay active. You can always lose the weight gained after you quit.  Find ways to improve your mood other than smoking. FOR MORE INFORMATION  www.smokefree.gov  Document Released: 01/12/2001 Document Revised: 07/20/2011 Document Reviewed: 04/29/2011 [facility] Patient Information 2014 [employer] [place].

## 2013-05-28 NOTE — Progress Notes (Signed)
HPI: This is a 63 year old female patient admitted with acute inferior STEMI with spontaneous reperfusion of the distal RCA by the time the first angiographic image was obtained. She underwent successful angioplasty and stenting of the distal RCA with a drug-eluting stent. She had a widely patent LAD, left main and circumflex arteries but all had diffuse luminal irregularities. She had normal LV function with inferior basal hypokinesis. She initially had some trouble with hypotension treated with dopamine.  Patient comes in today overall feeling pretty well. She denies any chest pain, palpitations, dyspnea, dyspnea on exertion, dizziness, or presyncope. She is trying to quit smoking and is down to 4 cigarettes daily. She says she is having panic attacks and is under a lot of stress because her son and husband both died in the past year and a half. She is anxious to get back to work. 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 She is walking without difficulty. She is also riding an exercise bike.  No Known Allergies  Current Outpatient Prescriptions on File Prior to Visit: aspirin EC 81 MG EC tablet, Take 1 tablet (81 mg total) by mouth daily., Disp: , Rfl:  DULoxetine (CYMBALTA) 30 MG capsule, Take 30 mg by mouth daily., Disp: , Rfl:  lisinopril (PRINIVIL,ZESTRIL) 2.5 MG tablet, Take 1 tablet (2.5 mg total) by mouth daily., Disp: 30 tablet, Rfl: 6 metoprolol tartrate (LOPRESSOR) 12.5 mg TABS tablet, Take 0.5 tablets (12.5 mg total) by mouth 2 (two) times daily., Disp: 60 tablet, Rfl: 6 nitroGLYCERIN (NITROSTAT) 0.4 MG SL tablet, Place 1 tablet (0.4 mg total) under the tongue every 5 (five) minutes x 3 doses as needed for chest pain., Disp: 25 tablet, Rfl: 12 simvastatin (ZOCOR) 40 MG tablet, Take 1 tablet (40 mg total) by mouth daily at 6 PM., Disp: 30 tablet, Rfl: 6 Ticagrelor (BRILINTA) 90 MG TABS tablet, Take 1 tablet (90 mg total) by mouth 2 (two) times daily., Disp: 60 tablet,  Rfl: 11  No current facility-administered medications on file prior to visit.   Past Medical History:   Depression                                                   Anxiety                                                      CAD (coronary artery disease)                                  Comment:a. 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   Tobacco abuse                                                HLD (hyperlipidemia)  HTN (hypertension)                                          No past surgical history on file.  No family history on file.   Social History   Marital Status: Single              Spouse Name:                      Years of Education:                 Number of children:             Occupational History   None on file  Social History Main Topics   Smoking Status: Current Every Day Smoker        Packs/Day: 1.00  Years:           Types: Cigarettes   Smokeless Status: Not on file                      Alcohol Use: No             Drug Use: No             Sexual Activity: Not on file        Other Topics            Concern   None on file  Social History Narrative   None on file    ROS: See history of present illness otherwise negative   PHYSICAL EXAM: Thin, emotional, in no acute distress. Neck: No JVD, HJR, Bruit, or thyroid enlargement  Lungs: Decreased breath sounds throughout, No tachypnea, clear without wheezing, rales, or rhonchi  Cardiovascular: RRR, PMI not displaced, heart sounds normal, no murmurs, gallops, bruit, thrill, or heave.  Abdomen: BS normal. Soft without organomegaly, masses, lesions or tenderness.  Extremities: Right arm at cath site without hematoma or hemorrhage good radial and brachial pulses, lower extremities without cyanosis, clubbing or edema. Good distal pulses bilateral  SKin: Warm, no lesions or rashes   Musculoskeletal: No deformities  Neuro: no focal signs  BP 120/60  Pulse 49  Ht  5\' 3"  (1.6 m)  Wt 111 lb (50.349 kg)  BMI 19.67 kg/m2  SpO2 95%    EKG: Sinus bradycardia 50 beats per minute with inferior Q waves T wave inversion inferiorly, no acute change   Cardiac catheterization 05/13/13 IMPRESSIONS: 1. Acute inferior ST elevation myocardial infarction with spontaneous reperfusion in the distal right coronary by the time the first angiographic image was obtained.   2. Successful angioplasty and stenting of the distal RCA with a 2.25 x 18 mm Alpine drug-eluting stent postdilated to 2.5 mm. The right coronary contains rather diffuse irregularities from proximal to distal vessel.   3. Widely patent LAD, left main, and circumflex arteries but with diffuse luminal irregularities .   4. Overall normal LV function with inferobasal hypokinesis.    2D ECHO 05/14/13 Study Conclusions - Left ventricle: The cavity size was normal. Wall thickness was normal. Systolic function was normal. The estimated ejection fraction was in the range of 60% to 65%. Wall motion was normal; there were no regional wall motion abnormalities. Left ventricular diastolic function parameters were normal. - Left atrium: The atrium was normal in size. - Tricuspid valve: Mild regurgitation. - Pulmonary arteries:  PA peak pressure: 30mm Hg (S). - Inferior vena cava: The vessel was normal in size; the respirophasic diameter changes were in the normal range (= 50%); findings are consistent with normal central venous pressure. - Pericardium, extracardiac: There was no pericardial Effusion.

## 2013-05-28 NOTE — Assessment & Plan Note (Signed)
On simvastatin 40 mg. check fasting lipid panel and LFTs in 4 weeks.

## 2013-05-28 NOTE — Assessment & Plan Note (Signed)
Patient suffered a STEMI treated with drug-eluting stent to the distal RCA on 05/13/13. She is doing well from a cardiac standpoint without further chest pain. We'll check fasting lipid panel and LFTs in 4 weeks. She may return to her work. Follow up with Dr. Garnette ScheuermannHank Smith in 2 months.

## 2013-05-28 NOTE — Assessment & Plan Note (Signed)
Stable. We'll not adjust medicines

## 2013-05-28 NOTE — Assessment & Plan Note (Signed)
Smoking cessation discussed in detail with patient. Recommend nicotine patches.

## 2013-06-01 ENCOUNTER — Encounter: Payer: BC Managed Care – PPO | Admitting: Nurse Practitioner

## 2013-06-26 ENCOUNTER — Encounter (HOSPITAL_COMMUNITY): Payer: BC Managed Care – PPO

## 2013-06-27 ENCOUNTER — Other Ambulatory Visit (INDEPENDENT_AMBULATORY_CARE_PROVIDER_SITE_OTHER): Payer: BC Managed Care – PPO

## 2013-06-27 DIAGNOSIS — E785 Hyperlipidemia, unspecified: Secondary | ICD-10-CM

## 2013-06-27 LAB — HEPATIC FUNCTION PANEL
ALBUMIN: 3.9 g/dL (ref 3.5–5.2)
ALT: 22 U/L (ref 0–35)
AST: 19 U/L (ref 0–37)
Alkaline Phosphatase: 86 U/L (ref 39–117)
BILIRUBIN DIRECT: 0.1 mg/dL (ref 0.0–0.3)
TOTAL PROTEIN: 6.9 g/dL (ref 6.0–8.3)
Total Bilirubin: 0.7 mg/dL (ref 0.2–1.2)

## 2013-06-27 LAB — LIPID PANEL
CHOL/HDL RATIO: 3
Cholesterol: 158 mg/dL (ref 0–200)
HDL: 47.6 mg/dL (ref >39.00)
LDL Cholesterol: 87 mg/dL (ref 0–99)
Triglycerides: 119 mg/dL (ref 0.0–149.0)
VLDL: 23.8 mg/dL (ref 0.0–40.0)

## 2013-07-03 ENCOUNTER — Telehealth: Payer: Self-pay | Admitting: *Deleted

## 2013-07-03 ENCOUNTER — Telehealth: Payer: Self-pay | Admitting: Interventional Cardiology

## 2013-07-03 NOTE — Telephone Encounter (Signed)
lmtcb for lab results. number provided 

## 2013-07-03 NOTE — Telephone Encounter (Signed)
°  Patient is returning your call, please call back.  °

## 2013-07-04 NOTE — Telephone Encounter (Signed)
Follow up ° ° ° ° ° °Pt want lab results °

## 2013-07-04 NOTE — Telephone Encounter (Signed)
Pt contacted about recent normal labs and to continue with current treatment regimen per Herma Carson PA.  Pt verbalized understanding and pleased with this news.

## 2013-07-04 NOTE — Telephone Encounter (Signed)
LMTCB about normal labs per Herma Carson.  Will forward to primary nurse for f/u.

## 2013-07-05 NOTE — Telephone Encounter (Signed)
lmtcb for lab results 12:36pm 07-05-13

## 2013-07-25 ENCOUNTER — Ambulatory Visit (INDEPENDENT_AMBULATORY_CARE_PROVIDER_SITE_OTHER): Payer: BC Managed Care – PPO | Admitting: Interventional Cardiology

## 2013-07-25 ENCOUNTER — Encounter: Payer: Self-pay | Admitting: Interventional Cardiology

## 2013-07-25 VITALS — BP 118/62 | HR 50 | Ht 63.0 in | Wt 111.0 lb

## 2013-07-25 DIAGNOSIS — F17200 Nicotine dependence, unspecified, uncomplicated: Secondary | ICD-10-CM

## 2013-07-25 DIAGNOSIS — I10 Essential (primary) hypertension: Secondary | ICD-10-CM

## 2013-07-25 DIAGNOSIS — E785 Hyperlipidemia, unspecified: Secondary | ICD-10-CM

## 2013-07-25 DIAGNOSIS — Z720 Tobacco use: Secondary | ICD-10-CM

## 2013-07-25 DIAGNOSIS — I2510 Atherosclerotic heart disease of native coronary artery without angina pectoris: Secondary | ICD-10-CM

## 2013-07-25 NOTE — Patient Instructions (Signed)
Your physician recommends that you continue on your current medications as directed. Please refer to the Current Medication list given to you today.  Your physician wants you to follow-up in: 4-6 months You will receive a reminder letter in the mail two months in advance. If you don't receive a letter, please call our office to schedule the follow-up appointment.  Your physician discussed the importance of regular exercise and recommended that you start or continue a regular exercise program for good health.  Your physician discussed the hazards of tobacco use. Tobacco use cessation is recommended and techniques and options to help you quit were discussed.

## 2013-07-25 NOTE — Progress Notes (Signed)
Patient ID: Kristin OliveLydia A Larabee, female   DOB: 05/21/1950, 63 y.o.   MRN: [id number]    [address] [place], [place]  [address] Phone: [phone] Fax:  [phone]  Date:  07/25/2013   ID:  Kristin OliveLydia A Mcphatter, DOB 06/29/1950, MRN [id number]  PCP:  Default, Provider, MD   ASSESSMENT:  1. Coronary artery disease with inferior ST elevation MI and DES implantation April 2015. She is asymptomatic without recurrent angina. 2. Depression status post death of her father and now in dealing with the diagnosis of CAD 3. Hyperlipidemia, on therapy and at target 4. Tobacco abuse  PLAN:  1. Encouraged smoking cessation 2. Active lifestyle 3. No change in medical management   SUBJECTIVE: Kristin Trujillo is a 63 y.o. female has been sad and depressed. Back to work. No physical limitations. Continues to smoke 2-4 cigarettes per day. Does not smoke at work. Denies chest pain. Occasional bruising related to dual antiplatelet therapy.   Wt Readings from Last 3 Encounters:  07/25/13 111 lb (50.349 kg)  05/28/13 111 lb (50.349 kg)  05/16/13 111 lb 14.1 oz (50.749 kg)     Past Medical History  Diagnosis Date  . Depression   . Anxiety   . CAD (coronary artery disease)     a. 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  . Tobacco abuse   . HLD (hyperlipidemia)   . HTN (hypertension)     Current Outpatient Prescriptions  Medication Sig Dispense Refill  . aspirin EC 81 MG EC tablet Take 1 tablet (81 mg total) by mouth daily.      . DULoxetine (CYMBALTA) 30 MG capsule Take 30 mg by mouth daily.      Marland Kitchen. lisinopril (PRINIVIL,ZESTRIL) 2.5 MG tablet Take 1 tablet (2.5 mg total) by mouth daily.  30 tablet  6  . metoprolol tartrate (LOPRESSOR) 12.5 mg TABS tablet Take 0.5 tablets (12.5 mg total) by mouth 2 (two) times daily.  60 tablet  6  . nitroGLYCERIN (NITROSTAT) 0.4 MG SL tablet Place 1 tablet (0.4 mg total) under the tongue every 5 (five) minutes x 3 doses as needed for chest pain.  25 tablet  12    . simvastatin (ZOCOR) 40 MG tablet Take 1 tablet (40 mg total) by mouth daily at 6 PM.  30 tablet  6  . Ticagrelor (BRILINTA) 90 MG TABS tablet Take 1 tablet (90 mg total) by mouth 2 (two) times daily.  60 tablet  11   No current facility-administered medications for this visit.    Allergies:   No Known Allergies  Social History:  The patient  reports that she has been smoking Cigarettes.  She has been smoking about 1.00 pack per day. She does not have any smokeless tobacco history on file. She reports that she does not drink alcohol or use illicit drugs.   ROS:  Please see the history of present illness.     All other systems reviewed and negative.   OBJECTIVE: VS:  BP 118/62  Pulse 50  Ht 5\' 3"  (1.6 m)  Wt 111 lb (50.349 kg)  BMI 19.67 kg/m2 Well nourished, well developed, in no acute distress, appears older than stated age, slender HEENT: normal Neck: JVD flat. Carotid bruit absent  Cardiac:  normal S1, S2; RRR; no murmur Lungs:  clear to auscultation bilaterally, no wheezing, rhonchi or rales Abd: soft, nontender, no hepatomegaly Ext: Edema absent. Pulses 2+ and symmetric Skin: warm and dry Neuro:  CNs  2-12 intact, no focal abnormalities noted  EKG:  Not performed       Signed, Darci NeedleHenry W. B. Brett Darko III, MD 07/25/2013 9:18 AM

## 2013-08-07 ENCOUNTER — Other Ambulatory Visit: Payer: Self-pay | Admitting: *Deleted

## 2013-08-07 MED ORDER — TICAGRELOR 90 MG PO TABS: 90.0000 mg | ORAL_TABLET | Freq: Two times a day (BID) | ORAL | Status: DC

## 2013-12-10 ENCOUNTER — Other Ambulatory Visit: Payer: Self-pay | Admitting: *Deleted

## 2013-12-10 MED ORDER — LISINOPRIL 2.5 MG PO TABS: 2.5000 mg | ORAL_TABLET | Freq: Every day | ORAL | Status: DC

## 2013-12-10 MED ORDER — SIMVASTATIN 40 MG PO TABS: 40.0000 mg | ORAL_TABLET | Freq: Every day | ORAL | Status: DC

## 2014-01-09 ENCOUNTER — Ambulatory Visit: Payer: BC Managed Care – PPO | Admitting: Interventional Cardiology

## 2014-01-10 ENCOUNTER — Encounter (HOSPITAL_COMMUNITY): Payer: Self-pay | Admitting: Interventional Cardiology

## 2014-02-05 ENCOUNTER — Other Ambulatory Visit: Payer: Self-pay | Admitting: Interventional Cardiology

## 2014-02-05 ENCOUNTER — Telehealth: Payer: Self-pay | Admitting: *Deleted

## 2014-02-05 NOTE — Telephone Encounter (Signed)
Switched to Plavix 75 mg daily and discontinue Brilinta at the end of her current supply. Don't miss a day's dose.

## 2014-02-05 NOTE — Telephone Encounter (Signed)
Patient wanted to see if the brilinta could be changed to something cheaper as the brilinta is going to cost her $800 for a 3 month supply. Please advise. Thanks, MI

## 2014-02-05 NOTE — Telephone Encounter (Signed)
returned pt call.lmom. will route to Dr.Smith for adv. pt to call back if samples are needed.

## 2014-02-06 MED ORDER — CLOPIDOGREL BISULFATE 75 MG PO TABS: 75.0000 mg | ORAL_TABLET | Freq: Every day | ORAL | Status: DC

## 2014-02-06 NOTE — Telephone Encounter (Signed)
Called to give pt Dr.Smiths recommendation. lmtcb 

## 2014-02-06 NOTE — Telephone Encounter (Signed)
Patient returned call and she is aware of msg per Dr Katrinka BlazingSmith concerning switching to plavix. Patient verbalized understanding. Rx sent in.

## 2014-02-12 ENCOUNTER — Ambulatory Visit (INDEPENDENT_AMBULATORY_CARE_PROVIDER_SITE_OTHER): Payer: BLUE CROSS/BLUE SHIELD | Admitting: Interventional Cardiology

## 2014-02-12 ENCOUNTER — Encounter: Payer: Self-pay | Admitting: Interventional Cardiology

## 2014-02-12 VITALS — BP 108/64 | HR 55 | Ht 63.0 in | Wt 115.0 lb

## 2014-02-12 DIAGNOSIS — E785 Hyperlipidemia, unspecified: Secondary | ICD-10-CM

## 2014-02-12 DIAGNOSIS — I10 Essential (primary) hypertension: Secondary | ICD-10-CM

## 2014-02-12 DIAGNOSIS — Z720 Tobacco use: Secondary | ICD-10-CM

## 2014-02-12 DIAGNOSIS — I2510 Atherosclerotic heart disease of native coronary artery without angina pectoris: Secondary | ICD-10-CM

## 2014-02-12 MED ORDER — CLOPIDOGREL BISULFATE 75 MG PO TABS: 75.0000 mg | ORAL_TABLET | Freq: Every day | ORAL | Status: AC

## 2014-02-12 NOTE — Progress Notes (Signed)
Patient ID: Kristin Trujillo, female   DOB: 08/06/1950, 64 y.o.   MRN: [id number]    [address] [place], [place]  [address] Phone: [phone] Fax:  [phone]  Date:  02/12/2014   ID:  Kristin Trujillo, DOB 01/27/1951, MRN [id number]  PCP:  Default, Provider, MD   ASSESSMENT:  1.  Coronary artery disease with prior right coronary infarction treated with drug-eluting stent , asymptomatic  2. Intermittent tobacco use  3. Hyperlipidemia, no laboratory data since last May  4. Anxiety disorder  PLAN:   1.  Discontinue lisinopril  2. Clinical follow-up in 6 months  3. Discontinue Plavix first of May  4. Active lifestyle  5. We discussed smoking cessation  6. Fasting lipid and hepatic panel   SUBJECTIVE: Kristin Trujillo is a 64 y.o. female  Who is doing well. She has not had angina. She still occasionally breaks out and has a cigarette. She is maintain an active lifestyle. She wants to get off of different medications. His been less than a year since right coronary was stented.   Wt Readings from Last 3 Encounters:  02/12/14 115 lb (52.164 kg)  07/25/13 111 lb (50.349 kg)  05/28/13 111 lb (50.349 kg)     Past Medical History  Diagnosis Date  . Depression   . Anxiety   . CAD (coronary artery disease)     a. 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  . Tobacco abuse   . HLD (hyperlipidemia)   . HTN (hypertension)     Current Outpatient Prescriptions  Medication Sig Dispense Refill  . aspirin EC 81 MG EC tablet Take 1 tablet (81 mg total) by mouth daily.    . clopidogrel (PLAVIX) 75 MG tablet Take 1 tablet (75 mg total) by mouth daily. 30 tablet 1  . DULoxetine (CYMBALTA) 30 MG capsule Take 30 mg by mouth daily.    Marland Kitchen. lisinopril (PRINIVIL,ZESTRIL) 2.5 MG tablet TAKE ONE TABLET BY MOUTH ONCE DAILY 30 tablet 0  . metoprolol tartrate (LOPRESSOR) 12.5 mg TABS tablet Take 0.5 tablets (12.5 mg total) by mouth 2 (two) times daily. 60 tablet 6  . nitroGLYCERIN (NITROSTAT)  0.4 MG SL tablet Place 1 tablet (0.4 mg total) under the tongue every 5 (five) minutes x 3 doses as needed for chest pain. 25 tablet 12  . simvastatin (ZOCOR) 40 MG tablet TAKE ONE TABLET BY MOUTH ONCE DAILY AT  6  PM 30 tablet 0   No current facility-administered medications for this visit.    Allergies:   No Known Allergies  Social History:  The patient  reports that she has been smoking Cigarettes.  She has been smoking about 1.00 pack per day. She does not have any smokeless tobacco history on file. She reports that she does not drink alcohol or use illicit drugs.   ROS:  Please see the history of present illness.    No blood in urine or stool. No neurological complaints. Occasional tobacco use.   All other systems reviewed and negative.   OBJECTIVE: VS:  BP 108/64 mmHg  Pulse 55  Ht 5\' 3"  (1.6 m)  Wt 115 lb (52.164 kg)  BMI 20.38 kg/m2 Well nourished, well developed, in no acute distress,  Slender but appears older than stated age HEENT: normal Neck: JVD  flat. Carotid bruit  absent  Cardiac:  normal S1, S2; RRR; no murmur Lungs:  clear to auscultation bilaterally, no wheezing, rhonchi or rales Abd: soft, nontender,  no hepatomegaly Ext: Edema  absent. Pulses  2+ Skin: warm and dry Neuro:  CNs 2-12 intact, no focal abnormalities noted  EKG:   Not repeated       Signed, Darci Needle III, MD 02/12/2014 11:49 AM

## 2014-02-12 NOTE — Patient Instructions (Signed)
Your physician has recommended you make the following change in your medication:  1.) STOP LISINOPRIL 2.) STOP TAKING PLAVIX AT THIS END OF April.  Your physician wants you to follow-up in: 6 MONTHS WITH DR. Katrinka BlazingSMITH.  You will receive a reminder letter in the mail two months in advance. If you don't receive a letter, please call our office to schedule the follow-up appointment.

## 2014-02-13 ENCOUNTER — Other Ambulatory Visit (INDEPENDENT_AMBULATORY_CARE_PROVIDER_SITE_OTHER): Payer: BLUE CROSS/BLUE SHIELD | Admitting: *Deleted

## 2014-02-13 DIAGNOSIS — E785 Hyperlipidemia, unspecified: Secondary | ICD-10-CM

## 2014-02-13 LAB — HEPATIC FUNCTION PANEL
ALBUMIN: 4.3 g/dL (ref 3.5–5.2)
ALK PHOS: 104 U/L (ref 39–117)
ALT: 16 U/L (ref 0–35)
AST: 17 U/L (ref 0–37)
BILIRUBIN TOTAL: 0.6 mg/dL (ref 0.2–1.2)
Bilirubin, Direct: 0 mg/dL (ref 0.0–0.3)
Total Protein: 7.3 g/dL (ref 6.0–8.3)

## 2014-02-13 LAB — LIPID PANEL
CHOLESTEROL: 157 mg/dL (ref 0–200)
HDL: 48 mg/dL (ref >39.00)
LDL Cholesterol: 88 mg/dL (ref 0–99)
NonHDL: 109
Total CHOL/HDL Ratio: 3
Triglycerides: 105 mg/dL (ref 0.0–149.0)
VLDL: 21 mg/dL (ref 0.0–40.0)

## 2014-03-08 ENCOUNTER — Other Ambulatory Visit: Payer: Self-pay | Admitting: Interventional Cardiology

## 2014-04-11 ENCOUNTER — Other Ambulatory Visit: Payer: Self-pay | Admitting: Interventional Cardiology

## 2014-05-07 ENCOUNTER — Other Ambulatory Visit: Payer: Self-pay | Admitting: Interventional Cardiology

## 2014-06-07 ENCOUNTER — Other Ambulatory Visit: Payer: Self-pay

## 2014-06-07 MED ORDER — SIMVASTATIN 40 MG PO TABS: ORAL_TABLET | ORAL | Status: DC

## 2014-06-07 MED ORDER — METOPROLOL TARTRATE 25 MG PO TABS: 12.5000 mg | ORAL_TABLET | Freq: Two times a day (BID) | ORAL | Status: DC

## 2014-09-13 ENCOUNTER — Ambulatory Visit: Payer: BLUE CROSS/BLUE SHIELD | Admitting: Interventional Cardiology

## 2014-11-28 ENCOUNTER — Ambulatory Visit (INDEPENDENT_AMBULATORY_CARE_PROVIDER_SITE_OTHER): Payer: 59 | Admitting: Interventional Cardiology

## 2014-11-28 ENCOUNTER — Encounter: Payer: Self-pay | Admitting: Interventional Cardiology

## 2014-11-28 VITALS — BP 130/82 | HR 54 | Ht 64.0 in | Wt 112.8 lb

## 2014-11-28 DIAGNOSIS — I2510 Atherosclerotic heart disease of native coronary artery without angina pectoris: Secondary | ICD-10-CM

## 2014-11-28 DIAGNOSIS — E785 Hyperlipidemia, unspecified: Secondary | ICD-10-CM | POA: Diagnosis not present

## 2014-11-28 DIAGNOSIS — I10 Essential (primary) hypertension: Secondary | ICD-10-CM

## 2014-11-28 NOTE — Patient Instructions (Signed)
Your physician recommends that you continue on your current medications as directed. Please refer to the Current Medication list given to you today. Your physician discussed the hazards of tobacco use. Tobacco use cessation is recommended and techniques and options to help you quit were discussed.  Your physician recommends that you return for lab work in: January 2017 (LIPIDS, LIVER FUNCTION)  Your physician wants you to follow-up in: 1 YEAR WITH DR. Katrinka BlazingSMITH.   You will receive a reminder letter in the mail two months in advance. If you don't receive a letter, please call our office to schedule the follow-up appointment.  Smoking Cessation, Tips for Success If you are ready to quit smoking, congratulations! You have chosen to help yourself be healthier. Cigarettes bring nicotine, tar, carbon monoxide, and other irritants into your body. Your lungs, heart, and blood vessels will be able to work better without these poisons. There are many different ways to quit smoking. Nicotine gum, nicotine patches, a nicotine inhaler, or nicotine nasal spray can help with physical craving. Hypnosis, support groups, and medicines help break the habit of smoking. WHAT THINGS CAN I DO TO MAKE QUITTING EASIER?  Here are some tips to help you quit for good:  Pick a date when you will quit smoking completely. Tell all of your friends and family about your plan to quit on that date.  Do not try to slowly cut down on the number of cigarettes you are smoking. Pick a quit date and quit smoking completely starting on that day.  Throw away all cigarettes.   Clean and remove all ashtrays from your home, work, and car.  On a card, write down your reasons for quitting. Carry the card with you and read it when you get the urge to smoke.  Cleanse your body of nicotine. Drink enough water and fluids to keep your urine clear or pale yellow. Do this after quitting to flush the nicotine from your body.  Learn to predict your  moods. Do not let a bad situation be your excuse to have a cigarette. Some situations in your life might tempt you into wanting a cigarette.  Never have "just one" cigarette. It leads to wanting another and another. Remind yourself of your decision to quit.  Change habits associated with smoking. If you smoked while driving or when feeling stressed, try other activities to replace smoking. Stand up when drinking your coffee. Brush your teeth after eating. Sit in a different chair when you read the paper. Avoid alcohol while trying to quit, and try to drink fewer caffeinated beverages. Alcohol and caffeine may urge you to smoke.  Avoid foods and drinks that can trigger a desire to smoke, such as sugary or spicy foods and alcohol.  Ask people who smoke not to smoke around you.  Have something planned to do right after eating or having a cup of coffee. For example, plan to take a walk or exercise.  Try a relaxation exercise to calm you down and decrease your stress. Remember, you may be tense and nervous for the first 2 weeks after you quit, but this will pass.  Find new activities to keep your hands busy. Play with a pen, coin, or rubber band. Doodle or draw things on paper.  Brush your teeth right after eating. This will help cut down on the craving for the taste of tobacco after meals. You can also try mouthwash.   Use oral substitutes in place of cigarettes. Try using lemon drops, carrots,  cinnamon sticks, or chewing gum. Keep them handy so they are available when you have the urge to smoke.  When you have the urge to smoke, try deep breathing.  Designate your home as a nonsmoking area.  If you are a heavy smoker, ask your health care provider about a prescription for nicotine chewing gum. It can ease your withdrawal from nicotine.  Reward yourself. Set aside the cigarette money you save and buy yourself something nice.  Look for support from others. Join a support group or smoking  cessation program. Ask someone at home or at work to help you with your plan to quit smoking.  Always ask yourself, "Do I need this cigarette or is this just a reflex?" Tell yourself, "Today, I choose not to smoke," or "I do not want to smoke." You are reminding yourself of your decision to quit.  Do not replace cigarette smoking with electronic cigarettes (commonly called e-cigarettes). The safety of e-cigarettes is unknown, and some may contain harmful chemicals.  If you relapse, do not give up! Plan ahead and think about what you will do the next time you get the urge to smoke. HOW WILL I FEEL WHEN I QUIT SMOKING? You may have symptoms of withdrawal because your body is used to nicotine (the addictive substance in cigarettes). You may crave cigarettes, be irritable, feel very hungry, cough often, get headaches, or have difficulty concentrating. The withdrawal symptoms are only temporary. They are strongest when you first quit but will go away within 10-14 days. When withdrawal symptoms occur, stay in control. Think about your reasons for quitting. Remind yourself that these are signs that your body is healing and getting used to being without cigarettes. Remember that withdrawal symptoms are easier to treat than the major diseases that smoking can cause.  Even after the withdrawal is over, expect periodic urges to smoke. However, these cravings are generally short lived and will go away whether you smoke or not. Do not smoke! WHAT RESOURCES ARE AVAILABLE TO HELP ME QUIT SMOKING? Your health care provider can direct you to community resources or hospitals for support, which may include:  Group support.  Education.  Hypnosis.  Therapy.   This information is not intended to replace advice given to you by your health care provider. Make sure you discuss any questions you have with your health care provider.   Document Released: 10/17/2003 Document Revised: 02/08/2014 Document Reviewed:  07/06/2012 [employer] Interactive Patient Education [employer]

## 2014-11-28 NOTE — Progress Notes (Signed)
Cardiology Office Note   Date:  11/28/2014   ID:  Kristin Trujillo, DOB Jan 19, 1951, MRN [id number]  PCP:  Default, Provider, MD  Cardiologist:  Lesleigh Noe, MD   Chief Complaint  Patient presents with  . Follow-up    6 Months f/u  . Coronary Artery Disease      History of Present Illness: Kristin Trujillo is a 64 y.o. female who presents for CAD, RCA DES 2015, tobacco use, and anxiety disorder.  She is somewhat perturbed today because her insurance plan requires a co-pay. She was not expecting to pay money. She has become very physically active. She is taking self defense aerobic exercise. She has not had angina. She denies palpitations and syncope. No medication side effects.  Past Medical History  Diagnosis Date  . Depression   . Anxiety   . CAD (coronary artery disease)     a. 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  . Tobacco abuse   . HLD (hyperlipidemia)   . HTN (hypertension)     Past Surgical History  Procedure Laterality Date  . Left heart catheterization with coronary angiogram N/A 05/13/2013    Procedure: LEFT HEART CATHETERIZATION WITH CORONARY ANGIOGRAM;  Surgeon: Lesleigh Noe, MD;  Location: [facility] CATH LAB;  Service: Cardiovascular;  Laterality: N/A;  . Percutaneous coronary stent intervention (pci-s)  05/13/2013    Procedure: PERCUTANEOUS CORONARY STENT INTERVENTION (PCI-S);  Surgeon: Lesleigh Noe, MD;  Location: [facility] CATH LAB;  Service: Cardiovascular;;     Current Outpatient Prescriptions  Medication Sig Dispense Refill  . aspirin EC 81 MG EC tablet Take 1 tablet (81 mg total) by mouth daily.    . metoprolol tartrate (LOPRESSOR) 25 MG tablet Take 0.5 tablets (12.5 mg total) by mouth 2 (two) times daily. 90 tablet 3  . nitroGLYCERIN (NITROSTAT) 0.4 MG SL tablet Place 1 tablet (0.4 mg total) under the tongue every 5 (five) minutes x 3 doses as needed for chest pain. 25 tablet 12  . simvastatin (ZOCOR) 40 MG tablet TAKE ONE TABLET BY MOUTH ONCE DAILY  AT 6PM 90 tablet 3   No current facility-administered medications for this visit.    Allergies:   Review of patient's allergies indicates no known allergies.    Social History:  The patient  reports that she has been smoking Cigarettes.  She has been smoking about 1.00 pack per day. She does not have any smokeless tobacco history on file. She reports that she does not drink alcohol or use illicit drugs.   Family History:  The patient's family history includes Cancer in her father; Hyperlipidemia in her mother.    ROS:  Please see the history of present illness.   Otherwise, review of systems are positive for anxiety and stress. Continue cigarette smoking..   All other systems are reviewed and negative.    PHYSICAL EXAM: VS:  BP 130/82 mmHg  Pulse 54  Ht  (1.626 m)  Wt 51.166 kg (112 lb 12.8 oz)  BMI 19.35 kg/m2 , BMI Body mass index is 19.35 kg/(m^2). GEN: Well nourished, well developed, in no acute distress HEENT: normal Neck: no JVD, carotid bruits, or masses Cardiac: RRR.  There is no murmur, rub, or gallop. There is no edema. Respiratory:  clear to auscultation bilaterally, normal work of breathing. GI: soft, nontender, nondistended, + BS MS: no deformity or atrophy Skin: warm and dry, no rash Neuro:  Strength and sensation are intact Psych: euthymic mood,  full affect   EKG:  EKG is ordered today. The ekg reveals sinus rhythm, incomplete right bundle, otherwise normal.   Recent Labs: 02/13/2014: ALT 16    Lipid Panel    Component Value Date/Time   CHOL 157 02/13/2014 0739   TRIG 105.0 02/13/2014 0739   HDL 48.00 02/13/2014 0739   CHOLHDL 3 02/13/2014 0739   VLDL 21.0 02/13/2014 0739   LDLCALC 88 02/13/2014 0739      Wt Readings from Last 3 Encounters:  11/28/14 51.166 kg (112 lb 12.8 oz)  02/12/14 52.164 kg (115 lb)  07/25/13 50.349 kg (111 lb)      Other studies Reviewed: Additional studies/ records that were reviewed today include: None. The  findings include none.    ASSESSMENT AND PLAN:  1. Coronary artery disease involving native coronary artery of native heart without angina pectoris No angina  2. Essential hypertension Controlled.  3. Hyperlipidemia with target LDL less than 70 Controlled.    Current medicines are reviewed at length with the patient today.  The patient has the following concerns regarding medicines: None.  The following changes/actions have been instituted:    Liver and lipid panel in January.  Smoking cessation  Labs/ tests ordered today include:   Orders Placed This Encounter  Procedures  . Lipid Profile  . Hepatic function panel  . EKG 12-Lead     Disposition:   FU with HS in 1 year  Signed, Lesleigh NoeSMITH III,HENRY W, MD  11/28/2014 1:52 PM    [facility] [address], [place], [place]  [id number] Phone: [phone]; Fax: [phone]

## 2015-02-03 ENCOUNTER — Other Ambulatory Visit: Payer: Self-pay | Admitting: Physician Assistant

## 2015-02-28 ENCOUNTER — Other Ambulatory Visit (INDEPENDENT_AMBULATORY_CARE_PROVIDER_SITE_OTHER): Payer: BLUE CROSS/BLUE SHIELD | Admitting: *Deleted

## 2015-02-28 DIAGNOSIS — E785 Hyperlipidemia, unspecified: Secondary | ICD-10-CM | POA: Diagnosis not present

## 2015-02-28 DIAGNOSIS — I10 Essential (primary) hypertension: Secondary | ICD-10-CM

## 2015-02-28 DIAGNOSIS — I2510 Atherosclerotic heart disease of native coronary artery without angina pectoris: Secondary | ICD-10-CM | POA: Diagnosis not present

## 2015-02-28 LAB — LIPID PANEL
Cholesterol: 183 mg/dL (ref 125–200)
HDL: 47 mg/dL (ref >=46)
LDL CALC: 111 mg/dL (ref <130)
Total CHOL/HDL Ratio: 3.9 Ratio (ref <=5.0)
Triglycerides: 123 mg/dL (ref <150)
VLDL: 25 mg/dL (ref <30)

## 2015-02-28 LAB — HEPATIC FUNCTION PANEL
ALK PHOS: 91 U/L (ref 33–130)
ALT: 19 U/L (ref 6–29)
AST: 20 U/L (ref 10–35)
Albumin: 4.2 g/dL (ref 3.6–5.1)
BILIRUBIN INDIRECT: 0.5 mg/dL (ref 0.2–1.2)
BILIRUBIN TOTAL: 0.6 mg/dL (ref 0.2–1.2)
Bilirubin, Direct: 0.1 mg/dL (ref <=0.2)
Total Protein: 7 g/dL (ref 6.1–8.1)

## 2015-02-28 NOTE — Addendum Note (Signed)
Addended by: Tonita Phoenix on: 02/28/2015 07:32 AM   Modules accepted: Orders

## 2015-03-06 ENCOUNTER — Telehealth: Payer: Self-pay

## 2015-03-06 DIAGNOSIS — E785 Hyperlipidemia, unspecified: Secondary | ICD-10-CM

## 2015-03-06 MED ORDER — ATORVASTATIN CALCIUM 40 MG PO TABS
40.0000 mg | ORAL_TABLET | Freq: Every day | ORAL | Status: DC
Start: 2015-03-06 — End: 2016-02-23

## 2015-03-06 NOTE — Telephone Encounter (Signed)
Pt aware of lab results and Dr.Smith's recommendation. Mildly to moderately elevated LDL. Worse than last year. Is she still on therapy? I think changing to atorvastatin 40 mg daily and getting on diet will help. Repeat liver and lipid 6-8 weeks.  Pt sts that she has been taking Simvastatin  daiy. Pt is agreeable to switch to Atorvastatin  daily. Rx sent to pt pharmacy. Pt adv to tighten up on her diet. Adv pt we will mail a reminder letter for her to call to schedule her fasting lab appt. Recall in Epic. Pt verbalized understanding.

## 2015-03-06 NOTE — Telephone Encounter (Signed)
-----   Message from Lyn Records, MD sent at 02/28/2015  6:36 PM EST ----- Mildly to moderately elevated LDL. Worse than last year. Is she still on therapy? I think changing to atorvastatin 40 mg daily and getting on diet will help. Repeat liver and lipid 6-8 weeks.

## 2015-03-31 ENCOUNTER — Telehealth: Payer: Self-pay | Admitting: Interventional Cardiology

## 2015-03-31 NOTE — Telephone Encounter (Signed)
Pt wants to know what can she take for her cold?

## 2015-03-31 NOTE — Telephone Encounter (Signed)
Returned pt call. Adv her it would be ok for her to take Coricidin, or plain Mucinex. Adv pt to stay hydrated. Pt voiced appreciation for the call back and verbalized understanding.

## 2015-06-18 ENCOUNTER — Other Ambulatory Visit: Payer: Self-pay | Admitting: Interventional Cardiology

## 2015-06-18 NOTE — Telephone Encounter (Signed)
Rx request sent to pharmacy.  

## 2016-02-23 ENCOUNTER — Encounter: Payer: Self-pay | Admitting: Interventional Cardiology

## 2016-02-23 ENCOUNTER — Ambulatory Visit (INDEPENDENT_AMBULATORY_CARE_PROVIDER_SITE_OTHER): Payer: BLUE CROSS/BLUE SHIELD | Admitting: Interventional Cardiology

## 2016-02-23 ENCOUNTER — Other Ambulatory Visit: Payer: BLUE CROSS/BLUE SHIELD | Admitting: *Deleted

## 2016-02-23 VITALS — BP 106/62 | HR 58 | Ht 64.0 in | Wt 111.8 lb

## 2016-02-23 DIAGNOSIS — I10 Essential (primary) hypertension: Secondary | ICD-10-CM

## 2016-02-23 DIAGNOSIS — I2510 Atherosclerotic heart disease of native coronary artery without angina pectoris: Secondary | ICD-10-CM

## 2016-02-23 DIAGNOSIS — E785 Hyperlipidemia, unspecified: Secondary | ICD-10-CM

## 2016-02-23 DIAGNOSIS — R0601 Orthopnea: Secondary | ICD-10-CM | POA: Insufficient documentation

## 2016-02-23 DIAGNOSIS — Z720 Tobacco use: Secondary | ICD-10-CM | POA: Diagnosis not present

## 2016-02-23 MED ORDER — NICOTINE 14 MG/24HR TD PT24: 14.0000 mg | MEDICATED_PATCH | Freq: Every day | TRANSDERMAL | 0 refills | Status: DC

## 2016-02-23 MED ORDER — NICOTINE 7 MG/24HR TD PT24: 7.0000 mg | MEDICATED_PATCH | Freq: Every day | TRANSDERMAL | 0 refills | Status: DC

## 2016-02-23 MED ORDER — METOPROLOL TARTRATE 25 MG PO TABS: 12.5000 mg | ORAL_TABLET | Freq: Two times a day (BID) | ORAL | 3 refills | Status: DC

## 2016-02-23 MED ORDER — ATORVASTATIN CALCIUM 40 MG PO TABS: 40.0000 mg | ORAL_TABLET | Freq: Every day | ORAL | 3 refills | Status: DC

## 2016-02-23 MED ORDER — NICOTINE 21 MG/24HR TD PT24
21.0000 mg | MEDICATED_PATCH | Freq: Every day | TRANSDERMAL | 0 refills | Status: DC
Start: 2016-02-23 — End: 2017-03-23

## 2016-02-23 NOTE — Patient Instructions (Signed)
Medication Instructions:  1) START using Nicotine Patches as instructed.  You will wear each dose of patch for one month and then decrease the dosage.   Labwork: None  Testing/Procedures: None  Follow-Up: Your physician wants you to follow-up in: 1 year with Dr. Katrinka Blazing.  You will receive a reminder letter in the mail two months in advance. If you don't receive a letter, please call our office to schedule the follow-up appointment.   Any Other Special Instructions Will Be Listed Below (If Applicable).   Steps to Quit Smoking Smoking tobacco can be bad for your health. It can also affect almost every organ in your body. Smoking puts you and people around you at risk for many serious long-lasting (chronic) diseases. Quitting smoking is hard, but it is one of the best things that you can do for your health. It is never too late to quit. What are the benefits of quitting smoking? When you quit smoking, you lower your risk for getting serious diseases and conditions. They can include:  Lung cancer or lung disease.  Heart disease.  Stroke.  Heart attack.  Not being able to have children (infertility).  Weak bones (osteoporosis) and broken bones (fractures). If you have coughing, wheezing, and shortness of breath, those symptoms may get better when you quit. You may also get sick less often. If you are pregnant, quitting smoking can help to lower your chances of having a baby of low birth weight. What can I do to help me quit smoking? Talk with your doctor about what can help you quit smoking. Some things you can do (strategies) include:  Quitting smoking totally, instead of slowly cutting back how much you smoke over a period of time.  Going to in-person counseling. You are more likely to quit if you go to many counseling sessions.  Using resources and support systems, such as:  Online chats with a Veterinary surgeon.  Phone quitlines.  Printed Materials engineer.  Support groups or  group counseling.  Text messaging programs.  Mobile phone apps or applications.  Taking medicines. Some of these medicines may have nicotine in them. If you are pregnant or breastfeeding, do not take any medicines to quit smoking unless your doctor says it is okay. Talk with your doctor about counseling or other things that can help you. Talk with your doctor about using more than one strategy at the same time, such as taking medicines while you are also going to in-person counseling. This can help make quitting easier. What things can I do to make it easier to quit? Quitting smoking might feel very hard at first, but there is a lot that you can do to make it easier. Take these steps:  Talk to your family and friends. Ask them to support and encourage you.  Call phone quitlines, reach out to support groups, or work with a Veterinary surgeon.  Ask people who smoke to not smoke around you.  Avoid places that make you want (trigger) to smoke, such as:  Bars.  Parties.  Smoke-break areas at work.  Spend time with people who do not smoke.  Lower the stress in your life. Stress can make you want to smoke. Try these things to help your stress:  Getting regular exercise.  Deep-breathing exercises.  Yoga.  Meditating.  Doing a body scan. To do this, close your eyes, focus on one area of your body at a time from head to toe, and notice which parts of your body are tense. Try  to relax the muscles in those areas.  Download or buy apps on your mobile phone or tablet that can help you stick to your quit plan. There are many free apps, such as QuitGuide from the [employer] developer(Centers for Disease Control and Prevention). You can find more support from smokefree.gov and other websites. This information is not intended to replace advice given to you by your health care provider. Make sure you discuss any questions you have with your health care provider. Document Released: 11/14/2008 Document Revised:  09/16/2015 Document Reviewed: 06/04/2014 Elsevier Interactive Patient Education  2017 [employer]    If you need a refill on your cardiac medications before your next appointment, please call your pharmacy.

## 2016-02-23 NOTE — Progress Notes (Signed)
Cardiology Office Note    Date:  02/23/2016   ID:  Kristin Trujillo, DOB 12/05/1950, MRN [id number]  PCP:  Default, Provider, MD  Cardiologist: Lesleigh NoeHenry W Smith III, MD   Chief Complaint  Patient presents with  . Hyperlipidemia    History of Present Illness:  Kristin Trujillo is a 66 y.o. female with prior history of CAD, right coronary stent, hyperlipidemia, and tobacco abuse.  Asymptomatic, without angina, dyspnea, or medication side effects. Still smoking a quarter pack of cigarettes per day.  Past Medical History:  Diagnosis Date  . Anxiety   . CAD (coronary artery disease)    a. 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  . Depression   . HLD (hyperlipidemia)   . HTN (hypertension)   . Tobacco abuse     Past Surgical History:  Procedure Laterality Date  . LEFT HEART CATHETERIZATION WITH CORONARY ANGIOGRAM N/A 05/13/2013   Procedure: LEFT HEART CATHETERIZATION WITH CORONARY ANGIOGRAM;  Surgeon: Lesleigh NoeHenry W Smith III, MD;  [facility];  Service: Cardiovascular;  Laterality: N/A;  . PERCUTANEOUS CORONARY STENT INTERVENTION (PCI-S)  05/13/2013   Procedure: PERCUTANEOUS CORONARY STENT INTERVENTION (PCI-S);  Surgeon: Lesleigh NoeHenry W Smith III, MD;  [facility];  Service: Cardiovascular;;    Current Medications: Outpatient Medications Prior to Visit  Medication Sig Dispense Refill  . aspirin EC 81 MG EC tablet Take 1 tablet (81 mg total) by mouth daily.    Marland Kitchen. NITROSTAT 0.4 MG SL tablet DISSOLVE ONE TABLET UNDER THE TONGUE EVERY 5 MINUTES AS NEEDED FOR CHEST PAIN.  DO NOT EXCEED A TOTAL OF 3 DOSES IN 15 MINUTES 25 tablet 3  . atorvastatin (LIPITOR) 40 MG tablet Take 1 tablet (40 mg total) by mouth daily. 90 tablet 3  . metoprolol tartrate (LOPRESSOR) 25 MG tablet TAKE ONE-HALF TABLET BY MOUTH TWICE DAILY 90 tablet 3   No facility-administered medications prior to visit.      Allergies:   Patient has no known allergies.   Social History   Social History  . Marital status:  Single    Spouse name: N/A  . Number of children: N/A  . Years of education: N/A   Social History Main Topics  . Smoking status: Current Every Day Smoker    Packs/day: 1.00    Types: Cigarettes  . Smokeless tobacco: Never Used  . Alcohol use No  . Drug use: No  . Sexual activity: No   Other Topics Concern  . None   Social History Narrative  . None     Family History:  The patient's family history includes Cancer in her father; Hyperlipidemia in her mother.   ROS:   Please see the history of present illness.    No laboratory data in over a year. Otherwise no complaints.  All other systems reviewed and are negative.   PHYSICAL EXAM:   VS:  BP 106/62 (BP Location: Right Arm)   Pulse (!) 58   Ht 5\' 4"  (1.626 m)   Wt 111 lb 12.8 oz (50.7 kg)   BMI 19.19 kg/m    GEN: Well nourished, well developed, in no acute distress  HEENT: normal  Neck: no JVD, carotid bruits, or masses Cardiac: RRR; no murmurs, rubs, or gallops,no edema  Respiratory:  clear to auscultation bilaterally, normal work of breathing GI: soft, nontender, nondistended, + BS MS: no deformity or atrophy  Skin: warm and dry, no rash Neuro:  Alert and Oriented x 3, Strength and  sensation are intact Psych: euthymic mood, full affect  Wt Readings from Last 3 Encounters:  02/23/16 111 lb 12.8 oz (50.7 kg)  11/28/14 112 lb 12.8 oz (51.2 kg)  02/12/14 115 lb (52.2 kg)      Studies/Labs Reviewed:   EKG:  EKG  Sinus bradycardia, incomplete right bundle branch block, otherwise unremarkable.  Recent Labs: 02/28/2015: ALT 19   Lipid Panel    Component Value Date/Time   CHOL 183 02/28/2015 0748   TRIG 123 02/28/2015 0748   HDL 47 02/28/2015 0748   CHOLHDL 3.9 02/28/2015 0748   VLDL 25 02/28/2015 0748   LDLCALC 111 02/28/2015 0748    Additional studies/ records that were reviewed today include:  None    ASSESSMENT:    1. Coronary artery disease involving native coronary artery of native heart  without angina pectoris   2. Essential hypertension   3. Hyperlipidemia with target LDL less than 70   4. Tobacco abuse      PLAN:  In order of problems listed above:  1. Asymptomatic with reference to angina or ischemic symptoms. 2. Excellent blood pressure control. 2 g sodium diet. 3. Current lipid levels are unknown. No blood work in over a year. 4. Continues to smoke quarter pack of cigarettes per day. Will start nicotine patch. I asked the patient to set a date to stop smoking.    Medication Adjustments/Labs and Tests Ordered: Current medicines are reviewed at length with the patient today.  Concerns regarding medicines are outlined above.  Medication changes, Labs and Tests ordered today are listed in the Patient Instructions below. Patient Instructions  Medication Instructions:  1) START using Nicotine Patches as instructed.  You will wear each dose of patch for one month and then decrease the dosage.   Labwork: None  Testing/Procedures: None  Follow-Up: Your physician wants you to follow-up in: 1 year with Dr. Katrinka Blazing.  You will receive a reminder letter in the mail two months in advance. If you don't receive a letter, please call our office to schedule the follow-up appointment.   Any Other Special Instructions Will Be Listed Below (If Applicable).   Steps to Quit Smoking Smoking tobacco can be bad for your health. It can also affect almost every organ in your body. Smoking puts you and people around you at risk for many serious long-lasting (chronic) diseases. Quitting smoking is hard, but it is one of the best things that you can do for your health. It is never too late to quit. What are the benefits of quitting smoking? When you quit smoking, you lower your risk for getting serious diseases and conditions. They can include:  Lung cancer or lung disease.  Heart disease.  Stroke.  Heart attack.  Not being able to have children (infertility).  Weak bones  (osteoporosis) and broken bones (fractures). If you have coughing, wheezing, and shortness of breath, those symptoms may get better when you quit. You may also get sick less often. If you are pregnant, quitting smoking can help to lower your chances of having a baby of low birth weight. What can I do to help me quit smoking? Talk with your doctor about what can help you quit smoking. Some things you can do (strategies) include:  Quitting smoking totally, instead of slowly cutting back how much you smoke over a period of time.  Going to in-person counseling. You are more likely to quit if you go to many counseling sessions.  Using resources and support systems,  such as:  Online chats with a Veterinary surgeon.  Phone quitlines.  Printed Materials engineer.  Support groups or group counseling.  Text messaging programs.  Mobile phone apps or applications.  Taking medicines. Some of these medicines may have nicotine in them. If you are pregnant or breastfeeding, do not take any medicines to quit smoking unless your doctor says it is okay. Talk with your doctor about counseling or other things that can help you. Talk with your doctor about using more than one strategy at the same time, such as taking medicines while you are also going to in-person counseling. This can help make quitting easier. What things can I do to make it easier to quit? Quitting smoking might feel very hard at first, but there is a lot that you can do to make it easier. Take these steps:  Talk to your family and friends. Ask them to support and encourage you.  Call phone quitlines, reach out to support groups, or work with a Veterinary surgeon.  Ask people who smoke to not smoke around you.  Avoid places that make you want (trigger) to smoke, such as:  Bars.  Parties.  Smoke-break areas at work.  Spend time with people who do not smoke.  Lower the stress in your life. Stress can make you want to smoke. Try these things to  help your stress:  Getting regular exercise.  Deep-breathing exercises.  Yoga.  Meditating.  Doing a body scan. To do this, close your eyes, focus on one area of your body at a time from head to toe, and notice which parts of your body are tense. Try to relax the muscles in those areas.  Download or buy apps on your mobile phone or tablet that can help you stick to your quit plan. There are many free apps, such as QuitGuide from the [employer] developer for Disease Control and Prevention). You can find more support from smokefree.gov and other websites. This information is not intended to replace advice given to you by your health care provider. Make sure you discuss any questions you have with your health care provider. Document Released: 11/14/2008 Document Revised: 09/16/2015 Document Reviewed: 06/04/2014 Elsevier Interactive Patient Education  2017 [employer].    If you need a refill on your cardiac medications before your next appointment, please call your pharmacy.      Signed, Lesleigh Noe, MD  02/23/2016 6:39 PM    [facility] [address] [place], [place], [place]  [address] Phone: [phone]; Fax: [phone]

## 2016-02-23 NOTE — Addendum Note (Signed)
Addended by: Tonita PhoenixBOWDEN, Allen Egerton K on: 02/23/2016 09:14 AM   Modules accepted: Orders

## 2016-02-24 LAB — ALT: ALT: 30 IU/L (ref 0–32)

## 2016-02-24 LAB — LIPID PANEL
CHOL/HDL RATIO: 3.7 ratio (ref 0.0–4.4)
Cholesterol, Total: 169 mg/dL (ref 100–199)
HDL: 46 mg/dL (ref >39)
LDL CALC: 90 mg/dL (ref 0–99)
TRIGLYCERIDES: 164 mg/dL — AB (ref 0–149)
VLDL Cholesterol Cal: 33 mg/dL (ref 5–40)

## 2016-05-26 ENCOUNTER — Other Ambulatory Visit: Payer: Self-pay | Admitting: Interventional Cardiology

## 2016-09-10 ENCOUNTER — Other Ambulatory Visit: Payer: Self-pay | Admitting: Interventional Cardiology

## 2017-01-10 ENCOUNTER — Other Ambulatory Visit: Payer: Self-pay | Admitting: Interventional Cardiology

## 2017-02-25 ENCOUNTER — Telehealth: Payer: Self-pay | Admitting: Interventional Cardiology

## 2017-02-25 DIAGNOSIS — E785 Hyperlipidemia, unspecified: Secondary | ICD-10-CM

## 2017-02-25 NOTE — Telephone Encounter (Signed)
New message     Patient request lab order prior to appointment. Please advise

## 2017-02-28 NOTE — Telephone Encounter (Signed)
Spoke with pt and scheduled her for labs on 03/18/17.

## 2017-03-18 ENCOUNTER — Other Ambulatory Visit: Payer: BLUE CROSS/BLUE SHIELD | Admitting: *Deleted

## 2017-03-18 DIAGNOSIS — E785 Hyperlipidemia, unspecified: Secondary | ICD-10-CM

## 2017-03-18 LAB — LIPID PANEL
CHOLESTEROL TOTAL: 151 mg/dL (ref 100–199)
Chol/HDL Ratio: 3 ratio (ref 0.0–4.4)
HDL: 51 mg/dL (ref >39)
LDL CALC: 76 mg/dL (ref 0–99)
TRIGLYCERIDES: 118 mg/dL (ref 0–149)
VLDL CHOLESTEROL CAL: 24 mg/dL (ref 5–40)

## 2017-03-18 LAB — HEPATIC FUNCTION PANEL
ALT: 23 IU/L (ref 0–32)
AST: 18 IU/L (ref 0–40)
Albumin: 4.5 g/dL (ref 3.6–4.8)
Alkaline Phosphatase: 120 IU/L — ABNORMAL HIGH (ref 39–117)
Bilirubin Total: 0.5 mg/dL (ref 0.0–1.2)
Bilirubin, Direct: 0.13 mg/dL (ref 0.00–0.40)
TOTAL PROTEIN: 6.8 g/dL (ref 6.0–8.5)

## 2017-03-21 NOTE — Progress Notes (Signed)
Pt has been made aware of normal result and verbalized understanding.  jw 03/21/17

## 2017-03-22 NOTE — Progress Notes (Signed)
Cardiology Office Note    Date:  03/23/2017   ID:  CARMELLE BAMBERG, DOB September 27, 1950, MRN [id number]  PCP:  Default, Provider, MD  Cardiologist: Lesleigh Noe, MD   Chief Complaint  Patient presents with  . Coronary Artery Disease    History of Present Illness:  Kristin Trujillo is a 67 y.o. female with prior history of CAD, right coronary stent, hyperlipidemia, and tobacco abuse.   Engage in aerobic activity at the [facility] at [employer].  No angina.  Overall no complaints.   Past Medical History:  Diagnosis Date  . Anxiety   . CAD (coronary artery disease)    a. 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  . Depression   . HLD (hyperlipidemia)   . HTN (hypertension)   . Tobacco abuse     Past Surgical History:  Procedure Laterality Date  . LEFT HEART CATHETERIZATION WITH CORONARY ANGIOGRAM N/A 05/13/2013   Procedure: LEFT HEART CATHETERIZATION WITH CORONARY ANGIOGRAM;  Surgeon: Lesleigh Noe, MD;  Location: [facility] CATH LAB;  Service: Cardiovascular;  Laterality: N/A;  . PERCUTANEOUS CORONARY STENT INTERVENTION (PCI-S)  05/13/2013   Procedure: PERCUTANEOUS CORONARY STENT INTERVENTION (PCI-S);  Surgeon: Lesleigh Noe, MD;  Location: [facility] CATH LAB;  Service: Cardiovascular;;    Current Medications: Outpatient Medications Prior to Visit  Medication Sig Dispense Refill  . aspirin EC 81 MG EC tablet Take 1 tablet (81 mg total) by mouth daily.    Marland Kitchen atorvastatin (LIPITOR) 40 MG tablet Take 1 tablet (40 mg total) by mouth daily. Please call [phone] to schedule annual appointment thanks. 90 tablet 0  . nitroGLYCERIN (NITROSTAT) 0.4 MG SL tablet DISSOLVE ONE TABLET UNDER THE TONGUE EVERY 5 MINUTES AS NEEDED FOR CHEST PAIN.  DO NOT EXCEED A TOTAL OF 3 DOSES IN 15 MINUTES 25 tablet 3  . metoprolol tartrate (LOPRESSOR) 25 MG tablet Take 0.5 tablets (12.5 mg total) by mouth 2 (two) times daily. Please call [phone] to schedule annual appointment thanks. 90 tablet 0  . nicotine  (NICODERM CQ) 14 mg/24hr patch Place 1 patch (14 mg total) onto the skin daily. For one month, then decrease to 7mg . (Patient not taking: Reported on 03/23/2017) 28 patch 0  . nicotine (NICODERM CQ) 21 mg/24hr patch Place 1 patch (21 mg total) onto the skin daily. For one month, then decrease to 14mg . (Patient not taking: Reported on 03/23/2017) 28 patch 0  . nicotine (NICODERM CQ) 7 mg/24hr patch Place 1 patch (7 mg total) onto the skin daily. (Patient not taking: Reported on 03/23/2017) 28 patch 0   No facility-administered medications prior to visit.      Allergies:   Patient has no known allergies.   Social History   Socioeconomic History  . Marital status: Single    Spouse name: None  . Number of children: None  . Years of education: None  . Highest education level: None  Social Needs  . Financial resource strain: None  . Food insecurity - worry: None  . Food insecurity - inability: None  . Transportation needs - medical: None  . Transportation needs - non-medical: None  Occupational History  . None  Tobacco Use  . Smoking status: Current Every Day Smoker    Packs/day: 1.00    Types: Cigarettes  . Smokeless tobacco: Never Used  Substance and Sexual Activity  . Alcohol use: No  . Drug use: No  . Sexual activity: No  Other Topics Concern  .  None  Social History Narrative  . None     Family History:  The patient's family history includes Cancer in her father; Hyperlipidemia in her mother.   ROS:   Please see the history of present illness.    Bilateral tinnitus. All other systems reviewed and are negative.   PHYSICAL EXAM:   VS:  BP 136/74   Pulse 66   Ht 5\' 4"  (1.626 m)   Wt 110 lb 12.8 oz (50.3 kg)   BMI 19.02 kg/m    GEN: Well nourished, well developed, in no acute distress  HEENT: normal  Neck: no JVD, carotid bruits, or masses Cardiac: RRR; no murmurs, rubs, or gallops,no edema  Respiratory:  clear to auscultation bilaterally, normal work of  breathing GI: soft, nontender, nondistended, + BS MS: no deformity or atrophy  Skin: warm and dry, no rash Neuro:  Alert and Oriented x 3, Strength and sensation are intact Psych: euthymic mood, full affect  Wt Readings from Last 3 Encounters:  03/23/17 110 lb 12.8 oz (50.3 kg)  02/23/16 111 lb 12.8 oz (50.7 kg)  11/28/14 112 lb 12.8 oz (51.2 kg)      Studies/Labs Reviewed:   EKG:  EKG sinus rhythm, topic atrial pacemaker rhythm, otherwise no significant change compared to prior.  Recent Labs: 03/18/2017: ALT 23   Lipid Panel    Component Value Date/Time   CHOL 151 03/18/2017 0741   TRIG 118 03/18/2017 0741   HDL 51 03/18/2017 0741   CHOLHDL 3.0 03/18/2017 0741   CHOLHDL 3.9 02/28/2015 0748   VLDL 25 02/28/2015 0748   LDLCALC 76 03/18/2017 0741    Additional studies/ records that were reviewed today include:  Cardiac catheterization May 13, 2013:    Cardiac catheterization 05/13/13 IMPRESSIONS: 1. Acute inferior ST elevation myocardial infarction with spontaneous reperfusion in the distal right coronary by the time the first angiographic image was obtained.   2. Successful angioplasty and stenting of the distal RCA with a 2.25 x 18 mm Alpine drug-eluting stent postdilated to 2.5 mm. The right coronary contains rather diffuse irregularities from proximal to distal vessel.   3. Widely patent LAD, left main, and circumflex arteries but with diffuse luminal irregularities .   4. Overall normal LV function with inferobasal hypokinesis.     ASSESSMENT:    1. Coronary artery disease involving native coronary artery of native heart without angina pectoris   2. Hyperlipidemia with target LDL less than 70   3. Tobacco abuse   4. Essential hypertension   5. Tinnitus aurium, bilateral      PLAN:  In order of problems listed above:  1. No cardiovascular complaints.  No functional testing required.  Maintain an active lifestyle. 2. Most recent LDL was 76.  Encouraged  continued compliance with medical therapy. 3. Discontinued use.  Encouraged continued abstinence. 4. Borderline blood pressure.  With discontinuation of metoprolol, will have her to return in 1 month for blood pressure check.  If significantly elevated above 140/90 mmHg, start amlodipine 2.5 or 5 mg daily.  If tinnitus is not resolved, could also resume low-dose beta-blocker therapy. 5. Possibly related to beta-blocker therapy.  Metoprolol tartrate has been discontinued.    Medication Adjustments/Labs and Tests Ordered: Current medicines are reviewed at length with the patient today.  Concerns regarding medicines are outlined above.  Medication changes, Labs and Tests ordered today are listed in the Patient Instructions below. Patient Instructions  Medication Instructions:  1) DISCONTINUE Metoprolol  Labwork: None  Testing/Procedures:  None  Follow-Up: Your physician recommends that you schedule a follow-up appointment in: 1 month with a PA or NP.  Your physician wants you to follow-up in: 1 year with Dr. Katrinka BlazingSmith.  You will receive a reminder letter in the mail two months in advance. If you don't receive a letter, please call our office to schedule the follow-up appointment.    Any Other Special Instructions Will Be Listed Below (If Applicable).     If you need a refill on your cardiac medications before your next appointment, please call your pharmacy.      Signed, Lesleigh NoeHenry W Adrianne Shackleton III, MD  03/23/2017 4:26 PM    [facility] [address], [place], [place]  [address] Phone: [phone]; Fax: [phone]

## 2017-03-23 ENCOUNTER — Encounter: Payer: Self-pay | Admitting: Interventional Cardiology

## 2017-03-23 ENCOUNTER — Ambulatory Visit: Payer: BLUE CROSS/BLUE SHIELD | Admitting: Interventional Cardiology

## 2017-03-23 VITALS — BP 136/74 | HR 66 | Ht 64.0 in | Wt 110.8 lb

## 2017-03-23 DIAGNOSIS — I10 Essential (primary) hypertension: Secondary | ICD-10-CM

## 2017-03-23 DIAGNOSIS — H9313 Tinnitus, bilateral: Secondary | ICD-10-CM

## 2017-03-23 DIAGNOSIS — Z720 Tobacco use: Secondary | ICD-10-CM | POA: Diagnosis not present

## 2017-03-23 DIAGNOSIS — I2510 Atherosclerotic heart disease of native coronary artery without angina pectoris: Secondary | ICD-10-CM

## 2017-03-23 DIAGNOSIS — E785 Hyperlipidemia, unspecified: Secondary | ICD-10-CM | POA: Diagnosis not present

## 2017-03-23 NOTE — Patient Instructions (Signed)
Medication Instructions:  1) DISCONTINUE Metoprolol  Labwork: None  Testing/Procedures: None  Follow-Up: Your physician recommends that you schedule a follow-up appointment in: 1 month with a PA or NP.  Your physician wants you to follow-up in: 1 year with Dr. Katrinka BlazingSmith.  You will receive a reminder letter in the mail two months in advance. If you don't receive a letter, please call our office to schedule the follow-up appointment.    Any Other Special Instructions Will Be Listed Below (If Applicable).     If you need a refill on your cardiac medications before your next appointment, please call your pharmacy.

## 2017-04-20 ENCOUNTER — Encounter: Payer: Self-pay | Admitting: Physician Assistant

## 2017-04-20 ENCOUNTER — Ambulatory Visit (INDEPENDENT_AMBULATORY_CARE_PROVIDER_SITE_OTHER): Payer: BLUE CROSS/BLUE SHIELD | Admitting: Physician Assistant

## 2017-04-20 VITALS — BP 130/70 | HR 69 | Ht 64.0 in | Wt 112.4 lb

## 2017-04-20 DIAGNOSIS — I10 Essential (primary) hypertension: Secondary | ICD-10-CM | POA: Diagnosis not present

## 2017-04-20 DIAGNOSIS — H9313 Tinnitus, bilateral: Secondary | ICD-10-CM

## 2017-04-20 NOTE — Patient Instructions (Addendum)
Medication Instructions:  1. Your physician recommends that you continue on your current medications as directed. Please refer to the Current Medication list given to you today.   Labwork: NONE ORDERED TODAY  Testing/Procedures: NONE ORDERED TODAY  Follow-Up: Your physician wants you to follow-up in: 1 YEAR WITH DR. Marlou StarksSMITH You will receive a reminder letter in the mail two months in advance. If you don't receive a letter, please call our office to schedule the follow-up appointment.   Any Other Special Instructions Will Be Listed Below (If Applicable).  CHECK BLOOD PRESSURE AND CALL IF RUNNING 140/90 OR HIGHER..    If you need a refill on your cardiac medications before your next appointment, please call your pharmacy.

## 2017-04-20 NOTE — Progress Notes (Signed)
Cardiology Office Note:    Date:  04/20/2017   ID:  MACKENZYE MACKEL, DOB [birth year], MRN [id number]  PCP:  Default, Provider, MD  Cardiologist:  Lesleigh Noe, MD   Referring MD: No ref. provider found   Chief Complaint  Patient presents with  . Follow-up    BP    History of Present Illness:    Kristin Trujillo is a 67 y.o. female with coronary artery disease status post inferior ST elevation microinfarction in 2015 treated with a drug-eluting stent to the distal RCA, hypertension, hyperlipidemia, tobacco abuse.  Last seen by Dr. Katrinka Blazing 03/23/17.  Patient complained of tinnitus and her beta-blocker was discontinued.  Kristin Trujillo returns for follow-up on hypertension and tinnitus.  She is here alone.  She notes her tinnitus is maybe a little better.  BP at home has been 120/70s.  Otherwise, she denies chest pain, shortness of breath, syncope, dizziness.   Prior CV studies:   The following studies were reviewed today:  Echo 05/14/13 EF 60-65, normal wall motion, normal diastolic function, mild TR, PASP 30  Cardiac catheterization 05/13/13 LM patent LAD okay LCx okay RCA mid 50-60, then 99 EF 65 PCI: 2.25 x 18 mm Xience Alpine DES to the distal RCA  Past Medical History:  Diagnosis Date  . Anxiety   . CAD (coronary artery disease)    a. 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  . Depression   . HLD (hyperlipidemia)   . HTN (hypertension)   . Tobacco abuse    Surgical Hx: The patient  has a past surgical history that includes left heart catheterization with coronary angiogram (N/A, 05/13/2013) and percutaneous coronary stent intervention (pci-s) (05/13/2013).   Current Medications: Current Meds  Medication Sig  . aspirin EC 81 MG EC tablet Take 1 tablet (81 mg total) by mouth daily.  Marland Kitchen atorvastatin (LIPITOR) 40 MG tablet Take 1 tablet (40 mg total) by mouth daily. Please call [phone] to schedule annual appointment thanks.  . nitroGLYCERIN (NITROSTAT) 0.4 MG SL tablet  DISSOLVE ONE TABLET UNDER THE TONGUE EVERY 5 MINUTES AS NEEDED FOR CHEST PAIN.  DO NOT EXCEED A TOTAL OF 3 DOSES IN 15 MINUTES     Allergies:   Patient has no known allergies.   Social History   Tobacco Use  . Smoking status: Former Smoker    Packs/day: 1.00    Types: Cigarettes  . Smokeless tobacco: Never Used  Substance Use Topics  . Alcohol use: No  . Drug use: No     Family Hx: The patient's family history includes Cancer in her father; Hyperlipidemia in her mother.  ROS:   Please see the history of present illness.    ROS All other systems reviewed and are negative.   EKGs/Labs/Other Test Reviewed:    EKG:  EKG is not ordered today.    Recent Labs: 03/18/2017: ALT 23   Recent Lipid Panel Lab Results  Component Value Date/Time   CHOL 151 03/18/2017 07:41 AM   TRIG 118 03/18/2017 07:41 AM   HDL 51 03/18/2017 07:41 AM   CHOLHDL 3.0 03/18/2017 07:41 AM   CHOLHDL 3.9 02/28/2015 07:48 AM   LDLCALC 76 03/18/2017 07:41 AM    Physical Exam:    VS:  BP 130/70   Pulse 69   Ht 5\' 4"  (1.626 m)   Wt 112 lb 6.4 oz (51 kg)   SpO2 97%   BMI 19.29 kg/m     Wt Readings from Last  3 Encounters:  04/20/17 112 lb 6.4 oz (51 kg)  03/23/17 110 lb 12.8 oz (50.3 kg)  02/23/16 111 lb 12.8 oz (50.7 kg)     Physical Exam  Constitutional: She is oriented to person, place, and time. She appears well-developed and well-nourished. No distress.  HENT:  Head: Normocephalic and atraumatic.  Neck: No JVD present. Carotid bruit is not present.  Cardiovascular: Normal rate and regular rhythm.  No murmur heard. Pulmonary/Chest: Effort normal. She has no rales.  Abdominal: Soft.  Musculoskeletal: She exhibits no edema.  Neurological: She is alert and oriented to person, place, and time.    ASSESSMENT & PLAN:    #1.  Essential hypertension Her blood pressure is well controlled on no medications.  She was placed on beta-blocker after her myocardial infarction.  Prior to that her  blood pressure was always low.  At this point, I think she can remain off of Metoprolol and not start on another blood pressure medication.  She will keep an eye on her blood pressure and let us know if it trends upward.  #2.  Tinnitus of both ears   I offered her a referral to ENT. She prefers to see primary care first.  She will establish with someone in the next month.   Dispo:  Return in about 11 months (around 03/23/2018) for Routine Follow Up w/ Dr. Katrinka BlazingSmith.   Medication Adjustments/Labs and Tests Ordered: Current medicines are reviewed at length with the patient today.  Concerns regarding medicines are outlined above.  Tests Ordered: No orders of the defined types were placed in this encounter.  Medication Changes: No orders of the defined types were placed in this encounter.   Signed, Tereso NewcomerScott Liesa Tsan, PA-C  04/20/2017 4:29 PM    [facility] [address], [place], [place]  [address] Phone: [phone](336) [phone]; Fax: [phone]

## 2017-05-27 ENCOUNTER — Other Ambulatory Visit: Payer: Self-pay | Admitting: Interventional Cardiology

## 2017-06-30 ENCOUNTER — Other Ambulatory Visit: Payer: Self-pay | Admitting: Interventional Cardiology

## 2017-06-30 NOTE — Telephone Encounter (Signed)
Outpatient Medication Detail    Disp Refills Start End   nitroGLYCERIN (NITROSTAT) 0.4 MG SL tablet 25 tablet 3 05/26/2016    Sig: DISSOLVE ONE TABLET UNDER THE TONGUE EVERY 5 MINUTES AS NEEDED FOR CHEST PAIN. DO NOT EXCEED A TOTAL OF 3 DOSES IN 15 MINUTES   Sent to pharmacy as: nitroGLYCERIN (NITROSTAT) 0.4 MG SL tablet   Notes to Pharmacy: Please consider 90 day supplies to promote better adherence   E-Prescribing Status: Receipt confirmed by pharmacy (05/26/2016 11:47 AM EDT)   Pharmacy   [id number] - [place], [place] - [address]

## 2017-08-06 ENCOUNTER — Ambulatory Visit (INDEPENDENT_AMBULATORY_CARE_PROVIDER_SITE_OTHER): Payer: Medicare HMO | Admitting: Family

## 2017-08-06 ENCOUNTER — Encounter: Payer: Self-pay | Admitting: Family

## 2017-08-06 VITALS — BP 147/76 | HR 68 | Temp 97.6°F | Ht 64.0 in | Wt 109.0 lb

## 2017-08-06 DIAGNOSIS — S20211A Contusion of right front wall of thorax, initial encounter: Secondary | ICD-10-CM | POA: Diagnosis not present

## 2017-08-06 DIAGNOSIS — N3001 Acute cystitis with hematuria: Secondary | ICD-10-CM | POA: Diagnosis not present

## 2017-08-06 DIAGNOSIS — R300 Dysuria: Secondary | ICD-10-CM

## 2017-08-06 MED ORDER — DICLOFENAC SODIUM 75 MG PO TBEC: 75.0000 mg | DELAYED_RELEASE_TABLET | Freq: Two times a day (BID) | ORAL | 0 refills | Status: DC

## 2017-08-06 MED ORDER — SULFAMETHOXAZOLE-TRIMETHOPRIM 800-160 MG PO TABS: 1.0000 | ORAL_TABLET | Freq: Two times a day (BID) | ORAL | 0 refills | Status: DC

## 2017-08-06 NOTE — Patient Instructions (Signed)
Rib Contusion A rib contusion is a deep bruise on your rib area. Contusions are the result of a blunt trauma that causes bleeding and injury to the tissues under the skin. A rib contusion may involve bruising of the ribs and of the skin and muscles in the area. The skin overlying the contusion may turn blue, purple, or yellow. Minor injuries will give you a painless contusion, but more severe contusions may stay painful and swollen for a few weeks. What are the causes? A contusion is usually caused by a blow, trauma, or direct force to an area of the body. This often occurs while playing contact sports. What are the signs or symptoms?  Swelling and redness of the injured area.  Discoloration of the injured area.  Tenderness and soreness of the injured area.  Pain with or without movement. How is this diagnosed? The diagnosis can be made by taking a medical history and performing a physical exam. An X-ray, CT scan, or MRI may be needed to determine if there were any associated injuries, such as broken bones (fractures) or internal injuries. How is this treated? Often, the best treatment for a rib contusion is rest. Icing or applying cold compresses to the injured area may help reduce swelling and inflammation. Deep breathing exercises may be recommended to reduce the risk of partial lung collapse and pneumonia. Over-the-counter or prescription medicines may also be recommended for pain control. Follow these instructions at home:  Apply ice to the injured area: ? Put ice in a plastic bag. ? Place a towel between your skin and the bag. ? Leave the ice on for 20 minutes, 2-3 times per day.  Take medicines only as directed by your health care provider.  Rest the injured area. Avoid strenuous activity and any activities or movements that cause pain. Be careful during activities and avoid bumping the injured area.  Perform deep-breathing exercises as directed by your health care provider.  Do  not lift anything that is heavier than 5 lb (2.3 kg) until your health care provider approves.  Do not use any tobacco products, including cigarettes, chewing tobacco, or electronic cigarettes. If you need help quitting, ask your health care provider. Contact a health care provider if:  You have increased bruising or swelling.  You have pain that is not controlled with treatment.  You have a fever. Get help right away if:  You have difficulty breathing or shortness of breath.  You develop a continual cough, or you cough up thick or bloody sputum.  You feel sick to your stomach (nauseous), you throw up (vomit), or you have abdominal pain. This information is not intended to replace advice given to you by your health care provider. Make sure you discuss any questions you have with your health care provider. Document Released: 10/13/2000 Document Revised: 06/26/2015 Document Reviewed: 10/30/2013 Elsevier Interactive Patient Education  2018 Elsevier Inc.  
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
## 2017-08-07 LAB — BMP8+EGFR
BUN/Creatinine Ratio: 16 (ref 12–28)
BUN: 15 mg/dL (ref 8–27)
CALCIUM: 10.1 mg/dL (ref 8.7–10.3)
CO2: 25 mmol/L (ref 20–29)
CREATININE: 0.94 mg/dL (ref 0.57–1.00)
Chloride: 104 mmol/L (ref 96–106)
GFR, EST AFRICAN AMERICAN: 74 mL/min/{1.73_m2} (ref >59)
GFR, EST NON AFRICAN AMERICAN: 64 mL/min/{1.73_m2} (ref >59)
Glucose: 91 mg/dL (ref 65–99)
POTASSIUM: 4.4 mmol/L (ref 3.5–5.2)
SODIUM: 143 mmol/L (ref 134–144)

## 2017-08-08 LAB — URINALYSIS
Bilirubin, UA: NEGATIVE
GLUCOSE, UA: NEGATIVE
KETONES UA: NEGATIVE
Nitrite, UA: NEGATIVE
PROTEIN UA: NEGATIVE
Specific Gravity, UA: 1.01 (ref 1.005–1.030)
UUROB: 0.2 mg/dL (ref 0.2–1.0)
pH, UA: 7 (ref 5.0–7.5)

## 2017-08-10 LAB — URINE CULTURE

## 2017-11-21 ENCOUNTER — Other Ambulatory Visit: Payer: Self-pay | Admitting: Interventional Cardiology

## 2017-11-21 MED ORDER — ATORVASTATIN CALCIUM 40 MG PO TABS: 40.0000 mg | ORAL_TABLET | Freq: Every day | ORAL | 1 refills | Status: DC

## 2018-02-09 ENCOUNTER — Encounter: Payer: Self-pay | Admitting: Family Medicine

## 2018-02-09 ENCOUNTER — Ambulatory Visit (INDEPENDENT_AMBULATORY_CARE_PROVIDER_SITE_OTHER): Payer: Medicare HMO | Admitting: Family Medicine

## 2018-02-09 VITALS — BP 148/85 | Temp 97.0°F | Ht 64.0 in | Wt 112.0 lb

## 2018-02-09 DIAGNOSIS — H66002 Acute suppurative otitis media without spontaneous rupture of ear drum, left ear: Secondary | ICD-10-CM

## 2018-02-09 DIAGNOSIS — F4329 Adjustment disorder with other symptoms: Secondary | ICD-10-CM | POA: Insufficient documentation

## 2018-02-09 DIAGNOSIS — E785 Hyperlipidemia, unspecified: Secondary | ICD-10-CM

## 2018-02-09 DIAGNOSIS — I2510 Atherosclerotic heart disease of native coronary artery without angina pectoris: Secondary | ICD-10-CM

## 2018-02-09 DIAGNOSIS — R69 Illness, unspecified: Secondary | ICD-10-CM | POA: Diagnosis not present

## 2018-02-09 HISTORY — DX: Adjustment disorder with other symptoms: F43.29

## 2018-02-09 MED ORDER — AMOXICILLIN-POT CLAVULANATE 875-125 MG PO TABS: 1.0000 | ORAL_TABLET | Freq: Two times a day (BID) | ORAL | 0 refills | Status: DC

## 2018-02-09 MED ORDER — ATORVASTATIN CALCIUM 40 MG PO TABS: 40.0000 mg | ORAL_TABLET | Freq: Every day | ORAL | 3 refills | Status: DC

## 2018-02-09 MED ORDER — CIPROFLOXACIN-DEXAMETHASONE 0.3-0.1 % OT SUSP: 4.0000 [drp] | Freq: Two times a day (BID) | OTIC | 0 refills | Status: AC

## 2018-02-09 NOTE — Progress Notes (Signed)
Subjective: CC: ear pain HPI: Kristin Trujillo is a 68 y.o. female presenting to clinic today for:  1. Ear pain Patient reports onset of left-sided ear pain over the last couple of days.  She notes that she has had some decreased hearing/buzzing in her ears bilaterally for the last several months and had actually recently come off of blood pressure medication efforts to relieve this.  The buzzing seems has worsened.  She notes that she is able to hear different frequencies out of different ears.  She has not been seen by audiology for this.  She denies any ear discharge from the left side but thinks it might be plugged up with wax.  She has not used any medications in efforts to relieve ear pain.  2.  Stress Patient reports increased stress related to an upcoming move.  She is trying to figure out if she wants to remain local versus moved back home to [place] to be closer to siblings.  She continues to have depressive symptoms related to the passing of her son and husband, who passed away in the same year.  She notes that this is the same year that she ended up having a heart attack as well.  She is not ready to be started on any medications for this and is trying to perform self soothing mechanisms at this time.  However she would be amenable in the future if she is unsuccessful.  3.  History of MI/CAD/hyperlipidemia Patient reports compliance with atorvastatin.  She needs refills on this.  She continues to follow-up with cardiology.  She reports being active 3 times weekly where she does water aerobics.  She maintains a balanced diet.  She notes a strong family history of hyperlipidemia.  Past Medical History:  Diagnosis Date  . Anxiety   . CAD (coronary artery disease)    a. 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  . Depression   . HLD (hyperlipidemia)   . HTN (hypertension)   . Tobacco abuse    Past Surgical History:  Procedure Laterality Date  . LEFT HEART CATHETERIZATION WITH CORONARY  ANGIOGRAM N/A 05/13/2013   Procedure: LEFT HEART CATHETERIZATION WITH CORONARY ANGIOGRAM;  Surgeon: Lesleigh Noe, MD;  Location: [facility] CATH LAB;  Service: Cardiovascular;  Laterality: N/A;  . PERCUTANEOUS CORONARY STENT INTERVENTION (PCI-S)  05/13/2013   Procedure: PERCUTANEOUS CORONARY STENT INTERVENTION (PCI-S);  Surgeon: Lesleigh Noe, MD;  Location: [facility] CATH LAB;  Service: Cardiovascular;;   Social History   Socioeconomic History  . Marital status: Single    Spouse name: Not on file  . Number of children: Not on file  . Years of education: Not on file  . Highest education level: Not on file  Occupational History  . Not on file  Social Needs  . Financial resource strain: Not on file  . Food insecurity:    Worry: Not on file    Inability: Not on file  . Transportation needs:    Medical: Not on file    Non-medical: Not on file  Tobacco Use  . Smoking status: Current Every Day Smoker    Packs/day: 0.25    Types: Cigarettes  . Smokeless tobacco: Never Used  Substance and Sexual Activity  . Alcohol use: No  . Drug use: No  . Sexual activity: Never  Lifestyle  . Physical activity:    Days per week: Not on file    Minutes per session: Not on file  .  Stress: Not on file  Relationships  . Social connections:    Talks on phone: Not on file    Gets together: Not on file    Attends religious service: Not on file    Active member of club or organization: Not on file    Attends meetings of clubs or organizations: Not on file    Relationship status: Not on file  . Intimate partner violence:    Fear of current or ex partner: Not on file    Emotionally abused: Not on file    Physically abused: Not on file    Forced sexual activity: Not on file  Other Topics Concern  . Not on file  Social History Narrative  . Not on file   Current Meds  Medication Sig  . aspirin EC 81 MG EC tablet Take 1 tablet (81 mg total) by mouth daily.  Marland Kitchen. atorvastatin (LIPITOR) 40 MG tablet Take 1  tablet (40 mg total) by mouth daily.  . nitroGLYCERIN (NITROSTAT) 0.4 MG SL tablet DISSOLVE ONE TABLET UNDER THE TONGUE EVERY 5 MINUTES AS NEEDED FOR CHEST PAIN.  DO NOT EXCEED A TOTAL OF 3 DOSES IN 15 MINUTES   Family History  Problem Relation Age of Onset  . Hyperlipidemia Mother   . Cancer Father    No Known Allergies   Health Maintenance: declines vaccines ROS: Per HPI  Objective: Office vital signs reviewed. BP (!) 148/85   Temp (!) 97 F (36.1 C) (Oral)   Ht 5\' 4"  (1.626 m)   Wt 112 lb (50.8 kg)   BMI 19.22 kg/m   Physical Examination:  General: Awake, alert, thin, No acute distress HEENT: Normal    Neck: No masses palpated. No lymphadenopathy    Ears: RTympanic membrane intact, normal light reflex, no erythema, no bulging; left tympanic membrane is occluded by thick, creamy discharge in the external auditory canal.  TM is not well visualized.  No tragal or mastoid tenderness palpation.    Eyes: PERRLA, extraocular movement in tact, sclera white Cardio: regular rate and rhythm, S1S2 heard, no murmurs appreciated Pulm: clear to auscultation bilaterally, no wheezes, rhonchi or rales; normal work of breathing on room air Psych: Mood somewhat depressed and patient is tearful intermittently.  She is engaging.  She maintains good eye contact.  She does not appear to respond to internal stimuli. Depression screen [facility] 2[facility] 02/09/2018 08/06/2017  Decreased Interest 1 0  Down, Depressed, Hopeless 1 0  PHQ - 2 Score 2 0  Altered sleeping 0 -  Tired, decreased energy 0 -  Change in appetite 0 -  Feeling bad or failure about yourself  0 -  Trouble concentrating 0 -  Moving slowly or fidgety/restless 0 -  Suicidal thoughts 0 -  PHQ-9 Score 2 -    Assessment/ Plan: 68 y.o. female   1. Non-recurrent acute suppurative otitis media of left ear without spontaneous rupture of tympanic membrane We attempted irrigation of the left ear but this was unsuccessful and caused patient pain.   Therefore I am empirically treating her with antibiotics.  Augmentin 875 p.o. twice daily prescribed.  I have also prescribed Ciprodex to help with ear discomfort.  Home care instructions reviewed and reasons return discussed.  We discussed considering ENT referral after her infection has resolved and she seems amenable to this.  2. Coronary artery disease involving native coronary artery of native heart without angina pectoris Followed by cardiology.  She is on a statin but per her report  antihypertensives were discontinued by her cardiologist.  Her blood pressure is somewhat elevated during today's exam and we may need to revisit this.  However, this was our first appointment together and I do wonder if stress and pain has played a part.  We will continue to follow her blood pressure closely and if it continues to be elevated we will have a low threshold to reinitiate her medications.  3. Hyperlipidemia with target LDL less than 70 Her statin has been refilled.  She will be due for fasting lipid panel next month.  Though she should continue statin for secondary prevention of MI and cardiovascular events.  4. Stress and adjustment reaction At this time wishes to pursue coping mechanisms.  She is not ready to start medications but is open to this if symptoms persist despite home efforts.  We will continue to follow with this closely.  Meds ordered this encounter  Medications  . atorvastatin (LIPITOR) 40 MG tablet    Sig: Take 1 tablet (40 mg total) by mouth daily.    Dispense:  90 tablet    Refill:  3  . amoxicillin-clavulanate (AUGMENTIN) 875-125 MG tablet    Sig: Take 1 tablet by mouth 2 (two) times daily.    Dispense:  20 tablet    Refill:  0  . ciprofloxacin-dexamethasone (CIPRODEX) OTIC suspension    Sig: Place 4 drops into the left ear 2 (two) times daily for 7 days.    Dispense:  7.5 mL    Refill:  0    Ashly Hulen Skains, DO [facility] [phone]

## 2018-02-09 NOTE — Patient Instructions (Signed)

## 2018-03-06 ENCOUNTER — Encounter: Payer: Self-pay | Admitting: Family Medicine

## 2018-03-06 ENCOUNTER — Ambulatory Visit (INDEPENDENT_AMBULATORY_CARE_PROVIDER_SITE_OTHER): Payer: Medicare HMO | Admitting: Family Medicine

## 2018-03-06 VITALS — BP 154/75 | HR 76 | Temp 97.6°F | Ht 64.0 in | Wt 112.0 lb

## 2018-03-06 DIAGNOSIS — J069 Acute upper respiratory infection, unspecified: Secondary | ICD-10-CM | POA: Diagnosis not present

## 2018-03-06 DIAGNOSIS — R062 Wheezing: Secondary | ICD-10-CM

## 2018-03-06 MED ORDER — AZITHROMYCIN 250 MG PO TABS: ORAL_TABLET | ORAL | 0 refills | Status: DC

## 2018-03-06 MED ORDER — METHYLPREDNISOLONE ACETATE 40 MG/ML IJ SUSP
40.0000 mg | Freq: Once | INTRAMUSCULAR | Status: AC
  Administered 2018-03-06: 40 mg via INTRAMUSCULAR

## 2018-03-06 NOTE — Progress Notes (Signed)
    Subjective:     Kristin Trujillo is a 68 y.o. female who presents for evaluation of symptoms of a URI. Symptoms include bilateral ear pressure/pain, congestion, coryza, low grade fever, nasal congestion, productive cough with  yellow and green colored sputum, purulent nasal discharge, shortness of breath, sore throat and wheezing. Onset of symptoms was 5 days ago, and has been gradually worsening since that time. Treatment to date: antihistamines and cough suppressants.  The following portions of the patient's history were reviewed and updated as appropriate: allergies, current medications, past family history, past medical history, past social history, past surgical history and problem list.  Review of Systems Constitutional: positive for chills, fatigue and fevers Eyes: negative Ears, nose, mouth, throat, and face: positive for earaches, hoarseness, nasal congestion and sore throat Respiratory: positive for cough, dyspnea on exertion, sputum and wheezing Cardiovascular: negative Gastrointestinal: negative Neurological: positive for headaches   Objective:    BP (!) 154/75   Pulse 76   Temp 97.6 F (36.4 C) (Oral)   Ht 5\' 4"  (1.626 m)   Wt 112 lb (50.8 kg)   BMI 19.22 kg/m  General appearance: alert, cooperative, appears stated age and moderate distress Head: Normocephalic, without obvious abnormality, atraumatic Eyes: conjunctivae/corneas clear. PERRL, EOM's intact. Fundi benign. Ears: abnormal TM right ear - serous middle ear fluid and abnormal TM left ear - serous middle ear fluid Nose: purulent and scant discharge, moderate congestion, turbinates red, swollen, no sinus tenderness Throat: abnormal findings: mild oropharyngeal erythema Neck: mild anterior cervical adenopathy, no carotid bruit, no JVD, supple, symmetrical, trachea midline and thyroid not enlarged, symmetric, no tenderness/mass/nodules Lungs: wheezes posterior - mild, bilateral Heart: regular rate and rhythm,  S1, S2 normal, no murmur, click, rub or gallop Skin: Skin color, texture, turgor normal. No rashes or lesions Neurologic: Grossly normal   Assessment:     Kristin Trujillo was seen today for uri.  Diagnoses and all orders for this visit:  URI with cough and congestion Increase fluid intake. Avoid cigarette smoke. Add humidity to the air. Medications as prescribed. Report any new or worsening symptoms.  -     azithromycin (ZITHROMAX) 250 MG tablet; Two tablets day one, then one tablet daily next 4 days. -     methylPREDNISolone acetate (DEPO-MEDROL) injection 40 mg  Wheezing -     methylPREDNISolone acetate (DEPO-MEDROL) injection 40 mg     Plan:    Discussed diagnosis and treatment of URI. Suggested symptomatic OTC remedies. Nasal saline spray for congestion. Follow up as needed. Call in 3 days if symptoms aren't resolving. Follow up with PCP in 2 weeks or as needed.    The above assessment and management plan was discussed with the patient. The patient verbalized understanding of and has agreed to the management plan. Patient is aware to call the clinic if symptoms fail to improve or worsen. Patient is aware when to return to the clinic for a follow-up visit. Patient educated on when it is appropriate to go to the emergency department.   Kari Baars, FNP-C [facility] [address] [place], [place] [address] [phone]

## 2018-03-06 NOTE — Patient Instructions (Signed)
It appears that you have a viral upper respiratory infection (cold).  Cold symptoms can last up to 2 weeks.    - Get plenty of rest and drink plenty of fluids. - Try to breathe moist air. Use a cold mist humidifier. - Consume warm fluids (soup or tea) to provide relief for a stuffy nose and to loosen phlegm. - For cough and congestion you can use plain Mucinex, regular or max strength, follow box directions.  - For nasal stuffiness, try saline nasal spray or a Neti Pot. You can use saline nasal spray 4 times daily. Do not use tap water in the Neti Pot, follow instructions on box for proper use. Afrin nasal spray can also be used but this product should not be used longer than 3 days or it will cause rebound nasal stuffiness (worsening nasal congestion). - For sore throat pain relief: use chloraseptic spray, suck on throat lozenges, hard candy or popsicles; gargle with warm salt water (1/4 tsp. salt per 8 oz. of water); and eat soft, bland foods. - For fever or aches and pains take tylenol or motrin as appropriate for age and weight.  - Eat a well-balanced diet. If you cannot, ensure you are getting enough nutrients by taking a daily multivitamin. - Avoid dairy products, as they can thicken phlegm. - Avoid alcohol, as it impairs your body's immune system. - Change your toothbrush in 3 days.   CONTACT YOUR DOCTOR IF YOU EXPERIENCE ANY OF THE FOLLOWING: - High fever - Ear pain - Sinus-type headache - Unusually severe cold symptoms - Cough that gets worse while other cold symptoms improve - Flare up of any chronic lung problem, such as asthma - Your symptoms persist longer than 2 weeks   

## 2018-08-01 ENCOUNTER — Other Ambulatory Visit: Payer: Self-pay | Admitting: Interventional Cardiology

## 2018-08-01 NOTE — Telephone Encounter (Signed)
°*  STAT* If patient is at the pharmacy, call can be transferred to refill team.   1. Which medications need to be refilled? (please list name of each medication and dose if known) nitroGLYCERIN (NITROSTAT) 0.4 MG SL tablet  2. Which pharmacy/location (including street and city if local pharmacy) is medication to be sent to? [place] [address]                 3. Do they need a 30 day or 90 day supply? 30 day

## 2018-08-01 NOTE — Telephone Encounter (Signed)
Patient aware that she is overdue for an appointment, but she has relocated. Okay to give one refill until she can establish? Please advise. Thanks, MI

## 2018-08-02 MED ORDER — NITROGLYCERIN 0.4 MG SL SUBL: SUBLINGUAL_TABLET | SUBLINGUAL | 1 refills | Status: DC

## 2018-08-02 NOTE — Telephone Encounter (Signed)
Pt's medication was sent to pt's pharmacy as requested. Confirmation received.  °

## 2018-08-02 NOTE — Telephone Encounter (Signed)
Ok to give one bottle.  Thanks!

## 2019-02-08 ENCOUNTER — Other Ambulatory Visit: Payer: Self-pay | Admitting: *Deleted

## 2019-02-08 NOTE — Addendum Note (Signed)
Addended by: Julious Payer D on: 02/08/2019 03:15 PM   Modules accepted: Orders

## 2019-02-14 ENCOUNTER — Encounter: Payer: Self-pay | Admitting: Family Medicine

## 2019-02-14 ENCOUNTER — Telehealth: Payer: Self-pay | Admitting: Family Medicine

## 2019-02-14 ENCOUNTER — Ambulatory Visit (INDEPENDENT_AMBULATORY_CARE_PROVIDER_SITE_OTHER): Payer: Medicare HMO | Admitting: Family Medicine

## 2019-02-14 DIAGNOSIS — E785 Hyperlipidemia, unspecified: Secondary | ICD-10-CM

## 2019-02-14 DIAGNOSIS — I2510 Atherosclerotic heart disease of native coronary artery without angina pectoris: Secondary | ICD-10-CM | POA: Diagnosis not present

## 2019-02-14 DIAGNOSIS — Z720 Tobacco use: Secondary | ICD-10-CM

## 2019-02-14 DIAGNOSIS — Z1159 Encounter for screening for other viral diseases: Secondary | ICD-10-CM

## 2019-02-14 MED ORDER — ATORVASTATIN CALCIUM 40 MG PO TABS: 40.0000 mg | ORAL_TABLET | Freq: Every day | ORAL | 3 refills | Status: DC

## 2019-02-14 NOTE — Progress Notes (Signed)
Telephone visit  Subjective: CC: f/u HLD PCP: Janora Norlander, DO STM:HDQQI Kristin Trujillo is Kristin 69 y.o. female calls for telephone consult today. Patient provides verbal consent for consult held via phone.  Due to COVID-19 pandemic this visit was conducted virtually. This visit type was conducted due to national recommendations for restrictions regarding the COVID-19 Pandemic (e.g. social distancing, sheltering in place) in an effort to limit this patient's exposure and mitigate transmission in our community. All issues noted in this document were discussed and addressed.  Kristin physical exam was not performed with this format.   Location of patient: home Location of provider: Working remotely from home Others present for call: none  1.  Hyperlipidemia with history of coronary artery disease Patient reports compliance with Lipitor 40 mg daily.  She reports compliance with baby aspirin.  She has not scheduled an office visit with her cardiologist, Dr. Tamala Julian.  She notes she has not been contacted.  Last office visit with him was in 2019 and he recommended 1 year follow-up.  She denies any chest pain, shortness of breath, dizziness, change in exercise tolerance or lower extremity edema.  Overall she notes that she is doing really well.  She actually recently moved back to [place] last September.  She moved briefly back up [place] but found that she was unhappy and extremely stressed out and subsequently has moved back to [place].  She is feeling much better now and is doing well.  She resides alone.  She does admit to having smoked more when she was stressed out and is currently smoking half Kristin pack per day.  This is down from what she was smoking during the stressful.  She is working on reducing this further.   ROS: Per HPI  No Known Allergies Past Medical History:  Diagnosis Date  . Anxiety   . CAD (coronary artery disease)    Kristin. 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  . Depression   .  HLD (hyperlipidemia)   . HTN (hypertension)   . Tobacco abuse     Current Outpatient Medications:  .  aspirin EC 81 MG EC tablet, Take 1 tablet (81 mg total) by mouth daily., Disp: , Rfl:  .  atorvastatin (LIPITOR) 40 MG tablet, Take 1 tablet (40 mg total) by mouth daily., Disp: 90 tablet, Rfl: 3 .  nitroGLYCERIN (NITROSTAT) 0.4 MG SL tablet, DISSOLVE ONE TABLET UNDER THE TONGUE EVERY 5 MINUTES AS NEEDED FOR CHEST PAIN.  DO NOT EXCEED Kristin TOTAL OF 3 DOSES IN 15 MINUTES, Disp: 25 tablet, Rfl: 1  Assessment/ Plan: 69 y.o. female   1. Coronary artery disease involving native coronary artery of native heart without angina pectoris Asymptomatic.  Doing well.  There is Kristin questionable increase in blood pressure.  She will be monitoring these daily at home and recording.  She will drop off this record when she comes in for her fasting lab work and we will plan on initiating antihypertensive pending these results. - Lipid panel; Future - CMP14+EGFR; Future - TSH; Future  2. Hyperlipidemia with target LDL less than 70 Continue statin.  This is been renewed - Lipid panel; Future - CMP14+EGFR; Future - TSH; Future  3. Encounter for hepatitis C screening test for low risk patient - HCV Antibody RFX to Quant PCR; Future  4. Tobacco abuse Counseling.  She is in the action phase of reducing the amount that she has been smoking.  Meds ordered this encounter  Medications  .  atorvastatin (LIPITOR) 40 MG tablet    Sig: Take 1 tablet (40 mg total) by mouth daily.    Dispense:  90 tablet    Refill:  3   Start time: 10:53am End time: 11:04am  Total time spent on patient care (including telephone call/ virtual visit): 20 minutes  [place], [place] [phone]

## 2019-03-26 ENCOUNTER — Other Ambulatory Visit: Payer: Medicare HMO

## 2019-03-26 ENCOUNTER — Other Ambulatory Visit: Payer: Self-pay

## 2019-03-26 DIAGNOSIS — I2510 Atherosclerotic heart disease of native coronary artery without angina pectoris: Secondary | ICD-10-CM

## 2019-03-26 DIAGNOSIS — Z1159 Encounter for screening for other viral diseases: Secondary | ICD-10-CM | POA: Diagnosis not present

## 2019-03-26 DIAGNOSIS — E785 Hyperlipidemia, unspecified: Secondary | ICD-10-CM

## 2019-03-27 ENCOUNTER — Telehealth: Payer: Self-pay | Admitting: *Deleted

## 2019-03-27 LAB — CMP14+EGFR
ALT: 30 IU/L (ref 0–32)
AST: 26 IU/L (ref 0–40)
Albumin/Globulin Ratio: 1.9 (ref 1.2–2.2)
Albumin: 4.8 g/dL (ref 3.8–4.8)
Alkaline Phosphatase: 138 IU/L — ABNORMAL HIGH (ref 39–117)
BUN/Creatinine Ratio: 19 (ref 12–28)
BUN: 18 mg/dL (ref 8–27)
Bilirubin Total: 0.5 mg/dL (ref 0.0–1.2)
CO2: 24 mmol/L (ref 20–29)
Calcium: 9.8 mg/dL (ref 8.7–10.3)
Chloride: 102 mmol/L (ref 96–106)
Creatinine, Ser: 0.96 mg/dL (ref 0.57–1.00)
GFR calc Af Amer: 71 mL/min/{1.73_m2} (ref >59)
GFR calc non Af Amer: 62 mL/min/{1.73_m2} (ref >59)
Globulin, Total: 2.5 g/dL (ref 1.5–4.5)
Glucose: 85 mg/dL (ref 65–99)
Potassium: 5 mmol/L (ref 3.5–5.2)
Sodium: 140 mmol/L (ref 134–144)
Total Protein: 7.3 g/dL (ref 6.0–8.5)

## 2019-03-27 LAB — LIPID PANEL
Chol/HDL Ratio: 2.7 ratio (ref 0.0–4.4)
Cholesterol, Total: 167 mg/dL (ref 100–199)
HDL: 61 mg/dL (ref >39)
LDL Chol Calc (NIH): 81 mg/dL (ref 0–99)
Triglycerides: 144 mg/dL (ref 0–149)
VLDL Cholesterol Cal: 25 mg/dL (ref 5–40)

## 2019-03-27 LAB — TSH: TSH: 1.9 u[IU]/mL (ref 0.450–4.500)

## 2019-03-27 LAB — HEPATITIS C ANTIBODY: Hep C Virus Ab: <0.1 s/co ratio (ref 0.0–0.9)

## 2019-03-27 NOTE — Telephone Encounter (Signed)
Patient aware.

## 2019-03-27 NOTE — Telephone Encounter (Signed)
-----   Message from Raliegh Ip, DO sent at 03/27/2019  7:51 AM EST ----- Please inform patient that Blood pressures are controlled.  No BP med needed at this time.

## 2019-03-30 ENCOUNTER — Other Ambulatory Visit: Payer: Self-pay | Admitting: Family Medicine

## 2019-03-30 ENCOUNTER — Telehealth: Payer: Self-pay | Admitting: Family Medicine

## 2019-03-30 DIAGNOSIS — R748 Abnormal levels of other serum enzymes: Secondary | ICD-10-CM

## 2019-03-30 NOTE — Telephone Encounter (Signed)
Patient aware.

## 2019-05-09 ENCOUNTER — Other Ambulatory Visit: Payer: Self-pay | Admitting: Family Medicine

## 2019-05-09 DIAGNOSIS — Z1231 Encounter for screening mammogram for malignant neoplasm of breast: Secondary | ICD-10-CM

## 2019-06-25 ENCOUNTER — Other Ambulatory Visit: Payer: Self-pay

## 2019-06-25 ENCOUNTER — Ambulatory Visit
Admission: RE | Admit: 2019-06-25 | Discharge: 2019-06-25 | Disposition: A | Discharge status: Home / Self Care | Payer: Medicare HMO | Source: Ambulatory Visit | Attending: Family Medicine | Admitting: Family Medicine

## 2019-06-25 DIAGNOSIS — Z1231 Encounter for screening mammogram for malignant neoplasm of breast: Secondary | ICD-10-CM

## 2020-02-14 ENCOUNTER — Other Ambulatory Visit: Payer: Self-pay | Admitting: Family Medicine

## 2020-02-14 DIAGNOSIS — I2510 Atherosclerotic heart disease of native coronary artery without angina pectoris: Secondary | ICD-10-CM

## 2020-02-14 DIAGNOSIS — E785 Hyperlipidemia, unspecified: Secondary | ICD-10-CM

## 2020-03-06 ENCOUNTER — Telehealth: Payer: Self-pay

## 2020-03-06 DIAGNOSIS — E785 Hyperlipidemia, unspecified: Secondary | ICD-10-CM

## 2020-03-06 DIAGNOSIS — I2510 Atherosclerotic heart disease of native coronary artery without angina pectoris: Secondary | ICD-10-CM

## 2020-03-06 MED ORDER — ATORVASTATIN CALCIUM 40 MG PO TABS: 40.0000 mg | ORAL_TABLET | Freq: Every day | ORAL | 0 refills | Status: DC

## 2020-03-06 NOTE — Telephone Encounter (Signed)
  Prescription Request  03/06/2020  What is the name of the medication or equipment? Atorvastatin  Have you contacted your pharmacy to request a refill? (if applicable) Yes  Which pharmacy would you like this sent to? [facility]  Pt scheduled an appt to see Dr Nadine Counts on 04/18/20 for med refill but will run out of Rx before her appt. Needs refill.

## 2020-03-06 NOTE — Telephone Encounter (Signed)
Sent a refill x1 to pharmacy. Patient notified and verbalized understanding

## 2020-03-10 ENCOUNTER — Ambulatory Visit: Payer: Medicare HMO | Admitting: Family Medicine

## 2020-04-15 ENCOUNTER — Ambulatory Visit (INDEPENDENT_AMBULATORY_CARE_PROVIDER_SITE_OTHER): Payer: Medicare HMO

## 2020-04-15 ENCOUNTER — Other Ambulatory Visit: Payer: Self-pay

## 2020-04-15 ENCOUNTER — Ambulatory Visit (INDEPENDENT_AMBULATORY_CARE_PROVIDER_SITE_OTHER): Payer: Medicare HMO | Admitting: Family Medicine

## 2020-04-15 ENCOUNTER — Encounter: Payer: Self-pay | Admitting: Family Medicine

## 2020-04-15 VITALS — BP 145/75 | HR 89 | Temp 96.8°F | Ht 64.0 in | Wt 106.0 lb

## 2020-04-15 DIAGNOSIS — Z720 Tobacco use: Secondary | ICD-10-CM

## 2020-04-15 DIAGNOSIS — Z638 Other specified problems related to primary support group: Secondary | ICD-10-CM

## 2020-04-15 DIAGNOSIS — Z780 Asymptomatic menopausal state: Secondary | ICD-10-CM

## 2020-04-15 DIAGNOSIS — R69 Illness, unspecified: Secondary | ICD-10-CM | POA: Diagnosis not present

## 2020-04-15 DIAGNOSIS — M810 Age-related osteoporosis without current pathological fracture: Secondary | ICD-10-CM | POA: Diagnosis not present

## 2020-04-15 DIAGNOSIS — E785 Hyperlipidemia, unspecified: Secondary | ICD-10-CM

## 2020-04-15 DIAGNOSIS — I2510 Atherosclerotic heart disease of native coronary artery without angina pectoris: Secondary | ICD-10-CM

## 2020-04-15 DIAGNOSIS — R6889 Other general symptoms and signs: Secondary | ICD-10-CM | POA: Diagnosis not present

## 2020-04-15 MED ORDER — ATORVASTATIN CALCIUM 40 MG PO TABS: 40.0000 mg | ORAL_TABLET | Freq: Every day | ORAL | 3 refills | Status: DC

## 2020-04-15 MED ORDER — NITROGLYCERIN 0.4 MG SL SUBL: SUBLINGUAL_TABLET | SUBLINGUAL | 1 refills | Status: DC

## 2020-04-15 NOTE — Progress Notes (Signed)
Subjective: CC: CAD, hyperlipidemia PCP: Janora Norlander, DO Kristin Trujillo is a 70 y.o. female presenting to clinic today for:  1.  CAD with history of MI.  Hyperlipidemia/stress at home Patient is compliant with Lipitor 40 mg daily.  She is not needed her Nitrostat.  She sees Dr. Tamala Julian for cardiology.  She has an up-to-date nitroglycerin prescription.  No reports of chest pain, shortness of breath.  She continues to smoke daily.  No hemoptysis.  She is not a physically active as she had been and often has been isolating lately.  She reports increased stressors.  She try to get back into the gym and get physical but she is worried about catching Covid and therefore this has been a barrier.  Mother was sick with COVID-19 in February.  She is 68 years old and resides at home alone about an hour away from [place].  The patient notes that she was pretty much the sole caregiver for her mother during that time.  She is the healthcare power of attorney.  She has reached out to her siblings for assistance but it seems like they are pretty much hands off.  This causes her quite a bit of stress.  ROS: Per HPI  No Known Allergies Past Medical History:  Diagnosis Date  . Anxiety   . CAD (coronary artery disease)    a. 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  . Depression   . HLD (hyperlipidemia)   . HTN (hypertension)   . Tobacco abuse     Current Outpatient Medications:  .  aspirin EC 81 MG EC tablet, Take 1 tablet (81 mg total) by mouth daily., Disp: , Rfl:  .  atorvastatin (LIPITOR) 40 MG tablet, Take 1 tablet (40 mg total) by mouth daily. (Needs to be seen before next refill), Disp: 30 tablet, Rfl: 0 .  nitroGLYCERIN (NITROSTAT) 0.4 MG SL tablet, DISSOLVE ONE TABLET UNDER THE TONGUE EVERY 5 MINUTES AS NEEDED FOR CHEST PAIN.  DO NOT EXCEED A TOTAL OF 3 DOSES IN 15 MINUTES, Disp: 25 tablet, Rfl: 1 Social History   Socioeconomic History  . Marital status: Single    Spouse name: Not on file   . Number of children: Not on file  . Years of education: Not on file  . Highest education level: Not on file  Occupational History  . Not on file  Tobacco Use  . Smoking status: Current Every Day Smoker    Packs/day: 0.50    Years: 50.00    Pack years: 25.00    Types: Cigarettes  . Smokeless tobacco: Never Used  Vaping Use  . Vaping Use: Never used  Substance and Sexual Activity  . Alcohol use: No  . Drug use: No  . Sexual activity: Never  Other Topics Concern  . Not on file  Social History Narrative  . Not on file   Social Determinants of Health   Financial Resource Strain: Not on file  Food Insecurity: Not on file  Transportation Needs: Not on file  Physical Activity: Not on file  Stress: Not on file  Social Connections: Not on file  Intimate Partner Violence: Not on file   Family History  Problem Relation Age of Onset  . Hyperlipidemia Mother   . Cancer Father     Objective: Office vital signs reviewed. BP (!) 145/75   Pulse 89   Temp (!) 96.8 F (36 C) (Temporal)   Ht '5\' 4"'  (1.626 m)   Wt  106 lb (48.1 kg)   SpO2 97%   BMI 18.19 kg/m   Physical Examination:  General: Awake, alert, well nourished, No acute distress Cardio: regular rate and rhythm, S1S2 heard, no murmurs appreciated Pulm: Prolonged expiratory phase.  No wheezes, rhonchi or rales; normal work of breathing on room air Extremities: warm, well perfused, No edema, cyanosis or clubbing; +2 pulses bilaterally MSK: Ambulating independently.  Pronounced thoracic kyphosis noted  Assessment/ Plan: 70 y.o. female   Coronary artery disease involving native coronary artery of native heart without angina pectoris - Plan: CMP14+EGFR, Lipid Panel, atorvastatin (LIPITOR) 40 MG tablet  Hyperlipidemia with target LDL less than 70 - Plan: CMP14+EGFR, Lipid Panel, atorvastatin (LIPITOR) 40 MG tablet  Tobacco abuse - Plan: CBC with Differential  Asymptomatic postmenopausal estrogen deficiency - Plan: DG  [facility] DEXA  Stress due to family tension  Lipitor sent.  Check fasting lipid panel, CMP  Cessation recommended.  Check DEXA scan  Offered pharmacologic intervention for stress but she is going to try increase physical activity to improve.  No orders of the defined types were placed in this encounter.  No orders of the defined types were placed in this encounter.    Janora Norlander, DO [place] [phone]

## 2020-04-16 ENCOUNTER — Telehealth: Payer: Self-pay

## 2020-04-16 LAB — LIPID PANEL
Chol/HDL Ratio: 3.1 ratio (ref 0.0–4.4)
Cholesterol, Total: 153 mg/dL (ref 100–199)
HDL: 49 mg/dL (ref >39)
LDL Chol Calc (NIH): 82 mg/dL (ref 0–99)
Triglycerides: 125 mg/dL (ref 0–149)
VLDL Cholesterol Cal: 22 mg/dL (ref 5–40)

## 2020-04-16 LAB — CBC WITH DIFFERENTIAL/PLATELET
Basophils Absolute: 0.1 10*3/uL (ref 0.0–0.2)
Basos: 1 %
EOS (ABSOLUTE): 0.1 10*3/uL (ref 0.0–0.4)
Eos: 1 %
Hematocrit: 46.5 % (ref 34.0–46.6)
Hemoglobin: 15.8 g/dL (ref 11.1–15.9)
Immature Grans (Abs): 0 10*3/uL (ref 0.0–0.1)
Immature Granulocytes: 0 %
Lymphocytes Absolute: 2.4 10*3/uL (ref 0.7–3.1)
Lymphs: 32 %
MCH: 31.3 pg (ref 26.6–33.0)
MCHC: 34 g/dL (ref 31.5–35.7)
MCV: 92 fL (ref 79–97)
Monocytes Absolute: 0.6 10*3/uL (ref 0.1–0.9)
Monocytes: 8 %
Neutrophils Absolute: 4.4 10*3/uL (ref 1.4–7.0)
Neutrophils: 58 %
Platelets: 187 10*3/uL (ref 150–450)
RBC: 5.04 x10E6/uL (ref 3.77–5.28)
RDW: 12.2 % (ref 11.7–15.4)
WBC: 7.5 10*3/uL (ref 3.4–10.8)

## 2020-04-16 LAB — CMP14+EGFR
ALT: 21 IU/L (ref 0–32)
AST: 22 IU/L (ref 0–40)
Albumin/Globulin Ratio: 2 (ref 1.2–2.2)
Albumin: 4.7 g/dL (ref 3.8–4.8)
Alkaline Phosphatase: 131 IU/L — ABNORMAL HIGH (ref 44–121)
BUN/Creatinine Ratio: 20 (ref 12–28)
BUN: 17 mg/dL (ref 8–27)
Bilirubin Total: 0.5 mg/dL (ref 0.0–1.2)
CO2: 25 mmol/L (ref 20–29)
Calcium: 9.9 mg/dL (ref 8.7–10.3)
Chloride: 103 mmol/L (ref 96–106)
Creatinine, Ser: 0.86 mg/dL (ref 0.57–1.00)
Globulin, Total: 2.3 g/dL (ref 1.5–4.5)
Glucose: 88 mg/dL (ref 65–99)
Potassium: 4.6 mmol/L (ref 3.5–5.2)
Sodium: 142 mmol/L (ref 134–144)
Total Protein: 7 g/dL (ref 6.0–8.5)
eGFR: 74 mL/min/{1.73_m2} (ref >59)

## 2020-04-18 ENCOUNTER — Other Ambulatory Visit: Payer: Self-pay | Admitting: Family Medicine

## 2020-04-18 ENCOUNTER — Ambulatory Visit: Payer: Medicare HMO | Admitting: Family Medicine

## 2020-04-18 DIAGNOSIS — M810 Age-related osteoporosis without current pathological fracture: Secondary | ICD-10-CM

## 2020-04-18 MED ORDER — ALENDRONATE SODIUM 70 MG PO TABS: 70.0000 mg | ORAL_TABLET | ORAL | 11 refills | Status: DC

## 2020-04-19 LAB — VITAMIN D 25 HYDROXY (VIT D DEFICIENCY, FRACTURES): Vit D, 25-Hydroxy: 31.2 ng/mL (ref 30.0–100.0)

## 2020-04-19 LAB — SPECIMEN STATUS REPORT

## 2020-04-23 ENCOUNTER — Ambulatory Visit: Payer: Medicare HMO | Admitting: Family Medicine

## 2020-04-24 ENCOUNTER — Telehealth: Payer: Self-pay

## 2020-04-24 NOTE — Telephone Encounter (Signed)
Pt has questions about osteoporosis medication please call back

## 2020-04-24 NOTE — Telephone Encounter (Signed)
Patient was just asking about vit d result gave her result patient verbalized understanding.

## 2020-06-02 ENCOUNTER — Telehealth: Payer: Self-pay

## 2020-06-02 ENCOUNTER — Other Ambulatory Visit: Payer: Self-pay | Admitting: Family Medicine

## 2020-06-02 DIAGNOSIS — M810 Age-related osteoporosis without current pathological fracture: Secondary | ICD-10-CM

## 2020-06-02 MED ORDER — RISEDRONATE SODIUM 150 MG PO TABS: 150.0000 mg | ORAL_TABLET | ORAL | 3 refills | Status: DC

## 2020-06-02 NOTE — Telephone Encounter (Signed)
Wants to stop taking Fosamax.  Makes her body hurt.  Is there something else she can start on?

## 2020-06-02 NOTE — Telephone Encounter (Signed)
Replaced with Actonel once monthly.  Make sure that she is taking vitamin D while she is taking these medications to ensure that vitamin D level stays normal.  If she continues to have pain, next plan will be to switch her over to Prolia

## 2020-08-26 ENCOUNTER — Other Ambulatory Visit: Payer: Self-pay | Admitting: Family Medicine

## 2020-08-26 DIAGNOSIS — M810 Age-related osteoporosis without current pathological fracture: Secondary | ICD-10-CM

## 2020-08-26 NOTE — Telephone Encounter (Signed)
  Prescription Request  08/26/2020  What is the name of the medication or equipment? Risedronate sodium  Have you contacted your pharmacy to request a refill? (if applicable) yes  Which pharmacy would you like this sent to? [facility] [place]   Patient notified that their request is being sent to the clinical staff for review and that they should receive a response within 2 business days.

## 2020-08-26 NOTE — Telephone Encounter (Signed)
[facility]net 06/02/20 for 1 year supply. Should have some refills on file. Just needs to call her pharmacy

## 2020-08-29 ENCOUNTER — Telehealth: Payer: Self-pay | Admitting: Family Medicine

## 2020-08-29 ENCOUNTER — Other Ambulatory Visit: Payer: Self-pay | Admitting: Family Medicine

## 2020-08-29 DIAGNOSIS — M810 Age-related osteoporosis without current pathological fracture: Secondary | ICD-10-CM

## 2020-08-29 MED ORDER — IBANDRONATE SODIUM 150 MG PO TABS: 150.0000 mg | ORAL_TABLET | ORAL | 4 refills | Status: DC

## 2020-08-29 NOTE — Telephone Encounter (Signed)
Pt went to pharmacy to get refill for Risedronate sodium and she is unable to afford because her insurance will no longer cover. She wants to know if anything else can be called in that would be cheaper or that would be covered by her insurance. Please call back and advise.

## 2020-08-29 NOTE — Telephone Encounter (Signed)
Pt aware.

## 2020-08-29 NOTE — Telephone Encounter (Signed)
Boniva sent instead.  Only other alternatives are once weekly.

## 2020-09-01 ENCOUNTER — Other Ambulatory Visit: Payer: Self-pay | Admitting: Family Medicine

## 2020-09-01 ENCOUNTER — Telehealth: Payer: Self-pay | Admitting: Family Medicine

## 2020-09-01 DIAGNOSIS — M810 Age-related osteoporosis without current pathological fracture: Secondary | ICD-10-CM

## 2020-09-01 MED ORDER — ALENDRONATE SODIUM 70 MG PO TABS: 70.0000 mg | ORAL_TABLET | ORAL | 3 refills | Status: DC

## 2020-09-01 NOTE — Telephone Encounter (Signed)
Pt called stating that her Medicare insurance is not covering the Boniva either. Needs another option.  Please advise and call patient.

## 2020-09-01 NOTE — Telephone Encounter (Signed)
I have replaced with the once weekly fosamax.  Is she sure that she doesn't have a deductible?

## 2020-09-01 NOTE — Telephone Encounter (Signed)
LMTCB

## 2021-01-08 ENCOUNTER — Ambulatory Visit (INDEPENDENT_AMBULATORY_CARE_PROVIDER_SITE_OTHER): Payer: Medicare HMO

## 2021-01-08 DIAGNOSIS — Z0000 Encounter for general adult medical examination without abnormal findings: Secondary | ICD-10-CM

## 2021-01-08 NOTE — Progress Notes (Signed)
MEDICARE ANNUAL WELLNESS VISIT  01/08/2021  Telephone Visit Disclaimer This Medicare AWV was conducted by telephone due to national recommendations for restrictions regarding the COVID-19 Pandemic (e.g. social distancing).  I verified, using two identifiers, that I am speaking with Kristin Trujillo or their authorized healthcare agent. I discussed the limitations, risks, security, and privacy concerns of performing an evaluation and management service by telephone and the potential availability of an in-person appointment in the future. The patient expressed understanding and agreed to proceed.  Location of Patient: Home Location of Provider (nurse):  [facility]  Subjective:    Kristin Trujillo is a 70 y.o. female patient of Raliegh Ip, DO who had a Medicare Annual Wellness Visit today via telephone. Kristin Trujillo is Retired and lives alone. She has two sons who are living and one son who is deceased and six grandchildren. She reports that she is socially active and does interact with friends/family regularly. She is moderately physically active and enjoys doing crafts and painting.  Patient Care Team: Raliegh Ip, DO as PCP - General (Family Medicine) Lyn Records, MD as PCP - Cardiology (Cardiology)  Advanced Directives 01/08/2021 05/13/2013  Does Patient Have a Medical Advance Directive? No Patient does not have advance directive;Patient would not like information  Would patient like information on creating a medical advance directive? No - Patient declined -  Pre-existing out of facility DNR order (yellow form or pink MOST form) - No    Hospital Utilization Over the Past 12 Months: # of hospitalizations or ER visits: 0 # of surgeries: 0  Review of Systems    Patient reports that her overall health is better compared to last year.  History obtained from chart review and the patient  Patient Reported Readings (BP, Pulse, CBG, Weight, etc) none  Pain Assessment Pain :  No/denies pain     Current Medications & Allergies (verified) Allergies as of 01/08/2021   No Known Allergies      Medication List        Accurate as of January 08, 2021  1:55 PM. If you have any questions, ask your nurse or doctor.          alendronate 70 MG tablet Commonly known as: FOSAMAX Take 1 tablet (70 mg total) by mouth every 7 (seven) days. Take with a full glass of water on an empty stomach.   aspirin 81 MG EC tablet Take 1 tablet (81 mg total) by mouth daily.   atorvastatin 40 MG tablet Commonly known as: LIPITOR Take 1 tablet (40 mg total) by mouth daily.   nitroGLYCERIN 0.4 MG SL tablet Commonly known as: NITROSTAT DISSOLVE ONE TABLET UNDER THE TONGUE EVERY 5 MINUTES AS NEEDED FOR CHEST PAIN.  DO NOT EXCEED A TOTAL OF 3 DOSES IN 15 MINUTES        History (reviewed): Past Medical History:  Diagnosis Date   Anxiety    CAD (coronary artery disease)    a. 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   Depression    HLD (hyperlipidemia)    HTN (hypertension)    Tobacco abuse    Past Surgical History:  Procedure Laterality Date   LEFT HEART CATHETERIZATION WITH CORONARY ANGIOGRAM N/A 05/13/2013   Procedure: LEFT HEART CATHETERIZATION WITH CORONARY ANGIOGRAM;  Surgeon: Lesleigh Noe, MD;  Location: [facility] CATH LAB;  Service: Cardiovascular;  Laterality: N/A;   PERCUTANEOUS CORONARY STENT INTERVENTION (PCI-S)  05/13/2013   Procedure: PERCUTANEOUS CORONARY STENT INTERVENTION (  PCI-S);  Surgeon: Lesleigh Noe, MD;  Location: [facility] CATH LAB;  Service: Cardiovascular;;   Family History  Problem Relation Age of Onset   Hyperlipidemia Mother    Cancer Father    Social History   Socioeconomic History   Marital status: Single    Spouse name: Not on file   Number of children: Not on file   Years of education: Not on file   Highest education level: Not on file  Occupational History   Not on file  Tobacco Use   Smoking status: Every Day    Packs/day: 0.50     Years: 50.00    Pack years: 25.00    Types: Cigarettes   Smokeless tobacco: Never  Vaping Use   Vaping Use: Never used  Substance and Sexual Activity   Alcohol use: No   Drug use: No   Sexual activity: Never  Other Topics Concern   Not on file  Social History Narrative   Not on file   Social Determinants of Health   Financial Resource Strain: Not on file  Food Insecurity: Not on file  Transportation Needs: Not on file  Physical Activity: Not on file  Stress: Not on file  Social Connections: Not on file    Activities of Daily Living In your present state of health, do you have any difficulty performing the following activities: 01/08/2021  Hearing? Y  Vision? N  Difficulty concentrating or making decisions? N  Walking or climbing stairs? N  Dressing or bathing? N  Doing errands, shopping? N  Preparing Food and eating ? N  Using the Toilet? N  In the past six months, have you accidently leaked urine? N  Do you have problems with loss of bowel control? N  Managing your Medications? N  Managing your Finances? N  Housekeeping or managing your Housekeeping? N  Some recent data might be hidden   Patient reports that she gets wax buildup in her ears which seems to cause some difficulty hearing.  Patient Education/ Literacy How often do you need to have someone help you when you read instructions, pamphlets, or other written materials from your doctor or pharmacy?: 1 - Never What is the last grade level you completed in school?: 12th grade  Exercise Current Exercise Habits: Home exercise routine, Type of exercise: walking, Time (Minutes): 60, Frequency (Times/Week): 7, Weekly Exercise (Minutes/Week): 420, Intensity: Mild, Exercise limited by: None identified  Diet Patient reports consuming 3 meals a day and 0 snack(s) a day Patient reports that her primary diet is: Regular Patient reports that she does have regular access to food.   Depression Screen PHQ 2/9 Scores  01/08/2021 04/15/2020 03/06/2018 02/09/2018 08/06/2017  PHQ - 2 Score 0 4 0 2 0  PHQ- 9 Score - 9 - 2 -     Fall Risk Fall Risk  01/08/2021 04/15/2020 02/09/2018 08/06/2017  Falls in the past year? 0 0 0 No  Follow up Falls evaluation completed - - -     Objective:  Kristin Trujillo seemed alert and oriented and she participated appropriately during our telephone visit.  Blood Pressure Weight BMI  BP Readings from Last 3 Encounters:  04/15/20 (!) 145/75  03/06/18 (!) 154/75  02/09/18 (!) 148/85   Wt Readings from Last 3 Encounters:  04/15/20 106 lb (48.1 kg)  03/06/18 112 lb (50.8 kg)  02/09/18 112 lb (50.8 kg)   BMI Readings from Last 1 Encounters:  04/15/20 18.19 kg/m    *Unable  to obtain current vital signs, weight, and BMI due to telephone visit type  Hearing/Vision  Kristin Trujillo did not seem to have difficulty with hearing/understanding during the telephone conversation Reports that she has not had a formal eye exam by an eye care professional within the past year Reports that she has not had a formal hearing evaluation within the past year *Unable to fully assess hearing and vision during telephone visit type  Cognitive Function: 6CIT Screen 01/08/2021  What Year? 0 points  What month? 0 points  What time? 0 points  Count back from 20 0 points  Months in reverse 0 points  Repeat phrase 0 points  Total Score 0   (Normal:0-7, Significant for Dysfunction: >8)  Normal Cognitive Function Screening: Yes   Immunization & Health Maintenance Record Immunization History  Administered Date(s) Administered   PFIZER Comirnaty(Gray Top)Covid-19 Tri-Sucrose Vaccine 04/20/2019, 05/11/2019, 02/10/2020    Health Maintenance  Topic Date Due   Pneumonia Vaccine 28+ Years old (1 - PCV) Never done   TETANUS/TDAP  Never done   COLONOSCOPY (Pts 45-91yrs Insurance coverage will need to be confirmed)  Never done   Zoster Vaccines- Shingrix (1 of 2) Never done   COVID-19 Vaccine (4 - Booster for  Pfizer series) 04/06/2020   INFLUENZA VACCINE  05/01/2021 (Originally 09/01/2020)   MAMMOGRAM  06/24/2021   DEXA SCAN  Completed   Hepatitis C Screening  Completed   HPV VACCINES  Aged Out       Assessment  This is a routine wellness examination for Kristin Trujillo.  Health Maintenance: Due or Overdue Health Maintenance Due  Topic Date Due   Pneumonia Vaccine 69+ Years old (1 - PCV) Never done   TETANUS/TDAP  Never done   COLONOSCOPY (Pts 45-70yrs Insurance coverage will need to be confirmed)  Never done   Zoster Vaccines- Shingrix (1 of 2) Never done   COVID-19 Vaccine (4 - Booster for Pfizer series) 04/06/2020    Kristin Trujillo does not need a referral for Community Assistance: Care Management:   no Social Work:    no Prescription Assistance:  no Nutrition/Diabetes Education:  no   Plan:  Personalized Goals  Goals Addressed             This Visit's Progress    Patient Stated       01/08/2021 AWV Goal: Fall Prevention  Over the next year, patient will decrease their risk for falls by: Using assistive devices, such as a cane or walker, as needed Identifying fall risks within their home and correcting them by: Removing throw rugs Adding handrails to stairs or ramps Removing clutter and keeping a clear pathway throughout the home Increasing light, especially at night Adding shower handles/bars Raising toilet seat Identifying potential personal risk factors for falls: Medication side effects Incontinence/urgency Vestibular dysfunction Hearing loss Musculoskeletal disorders Neurological disorders Orthostatic hypotension         Personalized Health Maintenance & Screening Recommendations  Pneumococcal vaccine  Td vaccine Colorectal cancer screening Shingrix vaccine  Lung Cancer Screening Recommended: yes (Low Dose CT Chest recommended if Age 75-80 years, 30 pack-year currently smoking OR have quit w/in past 15 years) Hepatitis C Screening recommended:  no HIV Screening recommended: no  Advanced Directives: Written information was not prepared per patient's request.  Referrals & Orders No orders of the defined types were placed in this encounter.   Follow-up Plan Follow-up with Raliegh Ip, DO as planned Schedule colonoscopy    I have personally reviewed  and noted the following in the patient's chart:   Medical and social history Use of alcohol, tobacco or illicit drugs  Current medications and supplements Functional ability and status Nutritional status Physical activity Advanced directives List of other physicians Hospitalizations, surgeries, and ER visits in previous 12 months Vitals Screenings to include cognitive, depression, and falls Referrals and appointments  In addition, I have reviewed and discussed with Kristin Trujillo certain preventive protocols, quality metrics, and best practice recommendations. A written personalized care plan for preventive services as well as general preventive health recommendations is available and can be mailed to the patient at her request.      Mariam Dollar, LPN    [id number]

## 2021-02-24 ENCOUNTER — Encounter: Payer: Self-pay | Admitting: Family Medicine

## 2021-02-24 ENCOUNTER — Ambulatory Visit (INDEPENDENT_AMBULATORY_CARE_PROVIDER_SITE_OTHER): Payer: Medicare HMO | Admitting: Family Medicine

## 2021-02-24 VITALS — BP 139/87 | HR 88 | Temp 96.7°F | Ht 64.0 in | Wt 102.0 lb

## 2021-02-24 DIAGNOSIS — E785 Hyperlipidemia, unspecified: Secondary | ICD-10-CM | POA: Diagnosis not present

## 2021-02-24 DIAGNOSIS — M810 Age-related osteoporosis without current pathological fracture: Secondary | ICD-10-CM | POA: Diagnosis not present

## 2021-02-24 DIAGNOSIS — Z0001 Encounter for general adult medical examination with abnormal findings: Secondary | ICD-10-CM | POA: Diagnosis not present

## 2021-02-24 DIAGNOSIS — Z0000 Encounter for general adult medical examination without abnormal findings: Secondary | ICD-10-CM

## 2021-02-24 DIAGNOSIS — I2510 Atherosclerotic heart disease of native coronary artery without angina pectoris: Secondary | ICD-10-CM

## 2021-02-24 DIAGNOSIS — J069 Acute upper respiratory infection, unspecified: Secondary | ICD-10-CM

## 2021-02-24 DIAGNOSIS — Z720 Tobacco use: Secondary | ICD-10-CM | POA: Diagnosis not present

## 2021-02-24 MED ORDER — ATORVASTATIN CALCIUM 40 MG PO TABS: 40.0000 mg | ORAL_TABLET | Freq: Every day | ORAL | 3 refills | Status: DC

## 2021-02-24 MED ORDER — AZELASTINE HCL 0.1 % NA SOLN
1.0000 | Freq: Two times a day (BID) | NASAL | 12 refills | Status: DC
Start: 2021-02-24 — End: 2022-08-31

## 2021-02-24 MED ORDER — ALBUTEROL SULFATE HFA 108 (90 BASE) MCG/ACT IN AERS: 2.0000 | INHALATION_SPRAY | Freq: Four times a day (QID) | RESPIRATORY_TRACT | 0 refills | Status: DC | PRN

## 2021-02-24 MED ORDER — BENZONATATE 100 MG PO CAPS
100.0000 mg | ORAL_CAPSULE | Freq: Three times a day (TID) | ORAL | 0 refills | Status: DC | PRN
Start: 2021-02-24 — End: 2021-07-10

## 2021-02-24 MED ORDER — ALENDRONATE SODIUM 70 MG PO TABS: 70.0000 mg | ORAL_TABLET | ORAL | 3 refills | Status: DC

## 2021-02-24 MED ORDER — NITROGLYCERIN 0.4 MG SL SUBL: SUBLINGUAL_TABLET | SUBLINGUAL | 1 refills | Status: DC

## 2021-02-24 NOTE — Progress Notes (Signed)
Kristin Trujillo is a 72 y.o. female presents to office today for annual physical exam examination.    Concerns today include: 1.  URI Patient reports onset over the weekend.  Seems to be gradually getting worse.  She has been having some drainage that feels like it is trying to get into her chest.  She reports coughing up yellow mucus.  No hemoptysis or brown sputum.  No fevers.  She does have some mild dyspnea on exertion.  She continues to smoke but has not smoked over the last couple of days while she has been sick.  No wheezes.  No nausea, vomiting, diarrhea or myalgias reported.  Not currently utilizing anything for treatment except for her Xyzal, which really has not been effective as of late.  She does not want to be tested for flu or COVID because she does not think that she has not.  She admits to not drinking enough water and perhaps this is why her mucus is not thinning out.  Occupation: Retired, Marital status: Widowed, Substance use: Cigarettes Diet: Balanced, home-cooked meals, Exercise: Walks her dogs 4 times per day Last colonoscopy: Up-to-date.  Not due until 11/2022. Last mammogram: Needs later this year Last pap smear: N/A Refills needed today: All Immunizations needed: Immunization History  Administered Date(s) Administered   PFIZER Comirnaty(Gray Top)Covid-19 Tri-Sucrose Vaccine 04/20/2019, 05/11/2019, 02/10/2020     Past Medical History:  Diagnosis Date   Anxiety    CAD (coronary artery disease)    a. 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   Depression    HLD (hyperlipidemia)    HTN (hypertension)    Tobacco abuse    Social History   Socioeconomic History   Marital status: Widowed    Spouse name: Not on file   Number of children: 3   Years of education: Not on file   Highest education level: Not on file  Occupational History   Not on file  Tobacco Use   Smoking status: Every Day    Packs/day: 0.50    Years: 50.00    Pack years: 25.00    Types:  Cigarettes   Smokeless tobacco: Never  Vaping Use   Vaping Use: Never used  Substance and Sexual Activity   Alcohol use: No   Drug use: No   Sexual activity: Not Currently  Other Topics Concern   Not on file  Social History Narrative   Patient is widowed.  Her husband passed away 04-17-2011 from colon cancer   Her middle son also passed away from some type of neuroendocrine cancer   She has 2 living sons.  Her eldest typically brings her to appointments.   She is an active every day smoker   She has dogs   She resides alone   Eats very healthy and prepares all meals   Social Determinants of Health   Financial Resource Strain: Not on file  Food Insecurity: Not on file  Transportation Needs: Not on file  Physical Activity: Not on file  Stress: Not on file  Social Connections: Not on file  Intimate Partner Violence: Not on file   Past Surgical History:  Procedure Laterality Date   LEFT HEART CATHETERIZATION WITH CORONARY ANGIOGRAM N/A 05/13/2013   Procedure: LEFT HEART CATHETERIZATION WITH CORONARY ANGIOGRAM;  Surgeon: Sinclair Grooms, MD;  Location: [facility] CATH LAB;  Service: Cardiovascular;  Laterality: N/A;   PERCUTANEOUS CORONARY STENT INTERVENTION (PCI-S)  05/13/2013   Procedure: PERCUTANEOUS CORONARY STENT INTERVENTION (PCI-S);  Surgeon: Sinclair Grooms, MD;  Location: [facility] CATH LAB;  Service: Cardiovascular;;   Family History  Problem Relation Age of Onset   Hyperlipidemia Mother    Cancer Father     Current Outpatient Medications:    alendronate (FOSAMAX) 70 MG tablet, Take 1 tablet (70 mg total) by mouth every 7 (seven) days. Take with a full glass of water on an empty stomach., Disp: 12 tablet, Rfl: 3   aspirin EC 81 MG EC tablet, Take 1 tablet (81 mg total) by mouth daily., Disp: , Rfl:    atorvastatin (LIPITOR) 40 MG tablet, Take 1 tablet (40 mg total) by mouth daily., Disp: 90 tablet, Rfl: 3   nitroGLYCERIN (NITROSTAT) 0.4 MG SL tablet, DISSOLVE ONE TABLET UNDER THE  TONGUE EVERY 5 MINUTES AS NEEDED FOR CHEST PAIN.  DO NOT EXCEED A TOTAL OF 3 DOSES IN 15 MINUTES, Disp: 25 tablet, Rfl: 1  No Known Allergies   ROS: Review of Systems Pertinent items noted in HPI and remainder of comprehensive ROS otherwise negative.    Physical exam BP 139/87    Pulse 88    Temp (!) 96.7 F (35.9 C)    Ht '5\' 4"'  (1.626 m)    Wt 102 lb (46.3 kg)    SpO2 96%    BMI 17.51 kg/m  General appearance: alert, cooperative, appears stated age, and no distress Head: Normocephalic, without obvious abnormality, atraumatic Eyes: negative findings: lids and lashes normal, conjunctivae and sclerae normal, corneas clear, and pupils equal, round, reactive to light and accomodation Ears:  Normal TM on right.  Left TM difficult to visualize secondary to narrow canal Nose:  Clear rhinorrhea Throat: lips, mucosa, and tongue normal; teeth and gums normal Neck: no adenopathy, no carotid bruit, supple, symmetrical, trachea midline, and thyroid not enlarged, symmetric, no tenderness/mass/nodules Back:  Increased thoracic kyphosis noted Lungs: clear to auscultation bilaterally Heart: regular rate and rhythm, S1, S2 normal, no murmur, click, rub or gallop Abdomen: soft, non-tender; bowel sounds normal; no masses,  no organomegaly Extremities: extremities normal, atraumatic, no cyanosis or edema Pulses: 2+ and symmetric Skin: Skin color, texture, turgor normal. No rashes or lesions Lymph nodes: Cervical, supraclavicular, and axillary nodes normal. Neurologic: Grossly normal Psych: Mood stable, speech normal  Flowsheet Row Office Visit from 02/24/2021 in [place]  PHQ-2 Total Score 0      Assessment/ Plan: Kristin Trujillo here for annual physical exam.   Annual physical exam  Coronary artery disease involving native coronary artery of native heart without angina pectoris - Plan: CMP14+EGFR, Lipid Panel, CBC, TSH, atorvastatin (LIPITOR) 40 MG tablet, nitroGLYCERIN  (NITROSTAT) 0.4 MG SL tablet  Hyperlipidemia with target LDL less than 70 - Plan: CMP14+EGFR, Lipid Panel, TSH, atorvastatin (LIPITOR) 40 MG tablet  Tobacco abuse - Plan: CBC  Age-related osteoporosis without current pathological fracture - Plan: CMP14+EGFR, CBC, VITAMIN D 25 Hydroxy (Vit-D Deficiency, Fractures), alendronate (FOSAMAX) 70 MG tablet  URI with cough and congestion - Plan: azelastine (ASTELIN) 0.1 % nasal spray, albuterol (VENTOLIN HFA) 108 (90 Base) MCG/ACT inhaler, benzonatate (TESSALON PERLES) 100 MG capsule  I was able to locate her colonoscopy results from 2014.  Non Fasting labs ordered.  Continue statin.  Nitro stat reordered to be placed on hold as her current supply will expire in June  Reinforced smoking cessation.  Patient is contemplative  Will not be due for next DEXA until next year.  Check vitamin D, calcium.  Continue Fosamax  URI appears  to be viral at this point.  Astelin nasal spray, Ventolin and Tessalon Perles prescribed.  Recommended Mucinex, increase p.o. hydration.  Home care instructions reviewed and handout was provided.  She understands reasons for reevaluation.  Follow-up as needed   Patient to follow up in 1 year for annual exam or sooner if needed.  Kristin Raju M. Lajuana Ripple, DO

## 2021-02-24 NOTE — Patient Instructions (Addendum)
It appears that you have a viral upper respiratory infection (cold).  Cold symptoms can last up to 2 weeks.  I have sent in a nasal spray to help with drainage and an inhaler if you feel short of breath/ wheezing.  Cough perles also sent.  - Get plenty of rest and drink plenty of fluids. - Try to breathe moist air. Use a cold mist humidifier. - Consume warm fluids (soup or tea) to provide relief for a stuffy nose and to loosen phlegm. - For nasal stuffiness, try saline nasal spray or a Neti Pot. Afrin nasal spray can also be used but this product should not be used longer than 3 days or it will cause rebound nasal stuffiness (worsening nasal congestion). - For sore throat pain relief: use chloraseptic spray, suck on throat lozenges, hard candy or popsicles; gargle with warm salt water (1/4 tsp. salt per 8 oz. of water); and eat soft, bland foods. - Eat a well-balanced diet. If you cannot, ensure you are getting enough nutrients by taking a daily multivitamin. - Avoid dairy products, as they can thicken phlegm. - Avoid alcohol, as it impairs your bodys immune system.  CONTACT YOUR DOCTOR IF YOU EXPERIENCE ANY OF THE FOLLOWING: - High fever - Ear pain - Sinus-type headache - Unusually severe cold symptoms - Cough that gets worse while other cold symptoms improve - Flare up of any chronic lung problem, such as asthma - Your symptoms persist longer than 2 weeks  DEXA scan due 04/2022.  Continue fosamax.  Preventive Care 81 Years and Older, Female Preventive care refers to lifestyle choices and visits with your health care provider that can promote health and wellness. Preventive care visits are also called wellness exams. What can I expect for my preventive care visit? Counseling Your health care provider may ask you questions about your: Medical history, including: Past medical problems. Family medical history. Pregnancy and menstrual history. History of falls. Current health,  including: Memory and ability to understand (cognition). Emotional well-being. Home life and relationship well-being. Sexual activity and sexual health. Lifestyle, including: Alcohol, nicotine or tobacco, and drug use. Access to firearms. Diet, exercise, and sleep habits. Work and work Statistician. Sunscreen use. Safety issues such as seatbelt and bike helmet use. Physical exam Your health care provider will check your: Height and weight. These may be used to calculate your BMI (body mass index). BMI is a measurement that tells if you are at a healthy weight. Waist circumference. This measures the distance around your waistline. This measurement also tells if you are at a healthy weight and may help predict your risk of certain diseases, such as type 2 diabetes and high blood pressure. Heart rate and blood pressure. Body temperature. Skin for abnormal spots. What immunizations do I need? Vaccines are usually given at various ages, according to a schedule. Your health care provider will recommend vaccines for you based on your age, medical history, and lifestyle or other factors, such as travel or where you work. What tests do I need? Screening Your health care provider may recommend screening tests for certain conditions. This may include: Lipid and cholesterol levels. Hepatitis C test. Hepatitis B test. HIV (human immunodeficiency virus) test. STI (sexually transmitted infection) testing, if you are at risk. Lung cancer screening. Colorectal cancer screening. Diabetes screening. This is done by checking your blood sugar (glucose) after you have not eaten for a while (fasting). Mammogram. Talk with your health care provider about how often you should have regular  mammograms. BRCA-related cancer screening. This may be done if you have a family history of breast, ovarian, tubal, or peritoneal cancers. Bone density scan. This is done to screen for osteoporosis. Talk with your health  care provider about your test results, treatment options, and if necessary, the need for more tests. Follow these instructions at home: Eating and drinking  Eat a diet that includes fresh fruits and vegetables, whole grains, lean protein, and low-fat dairy products. Limit your intake of foods with high amounts of sugar, saturated fats, and salt. Take vitamin and mineral supplements as recommended by your health care provider. Do not drink alcohol if your health care provider tells you not to drink. If you drink alcohol: Limit how much you have to 0-1 drink a day. Know how much alcohol is in your drink. In the [place] one drink equals one 12 oz bottle of beer (355 mL), one 5 oz glass of wine (148 mL), or one 1 oz glass of hard liquor (44 mL). Lifestyle Brush your teeth every morning and night with fluoride toothpaste. Floss one time each day. Exercise for at least 30 minutes 5 or more days each week. Do not use any products that contain nicotine or tobacco. These products include cigarettes, chewing tobacco, and vaping devices, such as e-cigarettes. If you need help quitting, ask your health care provider. Do not use drugs. If you are sexually active, practice safe sex. Use a condom or other form of protection in order to prevent STIs. Take aspirin only as told by your health care provider. Make sure that you understand how much to take and what form to take. Work with your health care provider to find out whether it is safe and beneficial for you to take aspirin daily. Ask your health care provider if you need to take a cholesterol-lowering medicine (statin). Find healthy ways to manage stress, such as: Meditation, yoga, or listening to music. Journaling. Talking to a trusted person. Spending time with friends and family. Minimize exposure to UV radiation to reduce your risk of skin cancer. Safety Always wear your seat belt while driving or riding in a vehicle. Do not drive: If you have  been drinking alcohol. Do not ride with someone who has been drinking. When you are tired or distracted. While texting. If you have been using any mind-altering substances or drugs. Wear a helmet and other protective equipment during sports activities. If you have firearms in your house, make sure you follow all gun safety procedures. What's next? Visit your health care provider once a year for an annual wellness visit. Ask your health care provider how often you should have your eyes and teeth checked. Stay up to date on all vaccines. This information is not intended to replace advice given to you by your health care provider. Make sure you discuss any questions you have with your health care provider. Document Revised: 07/16/2020 Document Reviewed: 07/16/2020 Elsevier Patient Education  [place].

## 2021-02-25 LAB — LIPID PANEL
Chol/HDL Ratio: 3 ratio (ref 0.0–4.4)
Cholesterol, Total: 143 mg/dL (ref 100–199)
HDL: 47 mg/dL (ref >39)
LDL Chol Calc (NIH): 76 mg/dL (ref 0–99)
Triglycerides: 108 mg/dL (ref 0–149)
VLDL Cholesterol Cal: 20 mg/dL (ref 5–40)

## 2021-02-25 LAB — CMP14+EGFR
ALT: 17 IU/L (ref 0–32)
AST: 16 IU/L (ref 0–40)
Albumin/Globulin Ratio: 1.7 (ref 1.2–2.2)
Albumin: 4.5 g/dL (ref 3.8–4.8)
Alkaline Phosphatase: 85 IU/L (ref 44–121)
BUN/Creatinine Ratio: 15 (ref 12–28)
BUN: 12 mg/dL (ref 8–27)
Bilirubin Total: 0.5 mg/dL (ref 0.0–1.2)
CO2: 25 mmol/L (ref 20–29)
Calcium: 9.8 mg/dL (ref 8.7–10.3)
Chloride: 101 mmol/L (ref 96–106)
Creatinine, Ser: 0.81 mg/dL (ref 0.57–1.00)
Globulin, Total: 2.7 g/dL (ref 1.5–4.5)
Glucose: 94 mg/dL (ref 70–99)
Potassium: 4.3 mmol/L (ref 3.5–5.2)
Sodium: 139 mmol/L (ref 134–144)
Total Protein: 7.2 g/dL (ref 6.0–8.5)
eGFR: 79 mL/min/{1.73_m2} (ref >59)

## 2021-02-25 LAB — CBC
Hematocrit: 41.9 % (ref 34.0–46.6)
Hemoglobin: 14.2 g/dL (ref 11.1–15.9)
MCH: 31.1 pg (ref 26.6–33.0)
MCHC: 33.9 g/dL (ref 31.5–35.7)
MCV: 92 fL (ref 79–97)
Platelets: 223 10*3/uL (ref 150–450)
RBC: 4.56 x10E6/uL (ref 3.77–5.28)
RDW: 11.4 % — ABNORMAL LOW (ref 11.7–15.4)
WBC: 8.9 10*3/uL (ref 3.4–10.8)

## 2021-02-25 LAB — TSH: TSH: 1.26 u[IU]/mL (ref 0.450–4.500)

## 2021-02-25 LAB — VITAMIN D 25 HYDROXY (VIT D DEFICIENCY, FRACTURES): Vit D, 25-Hydroxy: 109 ng/mL — ABNORMAL HIGH (ref 30.0–100.0)

## 2021-03-02 ENCOUNTER — Encounter: Payer: Self-pay | Admitting: Family Medicine

## 2021-03-02 ENCOUNTER — Ambulatory Visit (INDEPENDENT_AMBULATORY_CARE_PROVIDER_SITE_OTHER): Payer: Medicare HMO | Admitting: Family Medicine

## 2021-03-02 VITALS — BP 134/72 | HR 76 | Temp 97.5°F | Ht 64.0 in | Wt 101.0 lb

## 2021-03-02 DIAGNOSIS — N3090 Cystitis, unspecified without hematuria: Secondary | ICD-10-CM | POA: Diagnosis not present

## 2021-03-02 LAB — URINALYSIS, COMPLETE
Bilirubin, UA: NEGATIVE
Glucose, UA: NEGATIVE
Ketones, UA: NEGATIVE
Nitrite, UA: NEGATIVE
Protein,UA: NEGATIVE
Specific Gravity, UA: 1.01 (ref 1.005–1.030)
Urobilinogen, Ur: 0.2 mg/dL (ref 0.2–1.0)
pH, UA: 5.5 (ref 5.0–7.5)

## 2021-03-02 LAB — MICROSCOPIC EXAMINATION
Epithelial Cells (non renal): NONE SEEN /hpf (ref 0–10)
Renal Epithel, UA: NONE SEEN /hpf
WBC, UA: >30 /hpf — AB (ref 0–5)

## 2021-03-02 MED ORDER — SULFAMETHOXAZOLE-TRIMETHOPRIM 800-160 MG PO TABS: 1.0000 | ORAL_TABLET | Freq: Two times a day (BID) | ORAL | 0 refills | Status: DC

## 2021-03-02 NOTE — Progress Notes (Signed)
Subjective:  Patient ID: Kristin Trujillo, female    DOB: 1950-03-08  Age: 71 y.o. MRN: [id number]  CC: Cystitis   HPI [name] presents for burning with urination and frequency for 2 days. Denies fever . No flank pain. No nausea, vomiting.   Depression screen [facility] 2/9 03/02/2021 02/24/2021 01/08/2021  Decreased Interest 0 0 0  Down, Depressed, Hopeless 0 0 0  PHQ - 2 Score 0 0 0  Altered sleeping - - -  Tired, decreased energy - - -  Change in appetite - - -  Feeling bad or failure about yourself  - - -  Trouble concentrating - - -  Moving slowly or fidgety/restless - - -  Suicidal thoughts - - -  PHQ-9 Score - - -    History Kemberly has a past medical history of Anxiety, CAD (coronary artery disease), Depression, HLD (hyperlipidemia), HTN (hypertension), and Tobacco abuse.   She has a past surgical history that includes left heart catheterization with coronary angiogram (N/A, 05/13/2013) and percutaneous coronary stent intervention (pci-s) (05/13/2013).   Her family history includes Cancer in her father and son; Hyperlipidemia in her mother.She reports that she has been smoking cigarettes. She has a 25.00 pack-year smoking history. She has never used smokeless tobacco. She reports that she does not drink alcohol and does not use drugs.    ROS Review of Systems  Constitutional:  Negative for chills, diaphoresis and fever.  HENT:  Negative for congestion.   Eyes:  Negative for visual disturbance.  Respiratory:  Negative for cough and shortness of breath.   Cardiovascular:  Negative for chest pain and palpitations.  Gastrointestinal:  Negative for constipation, diarrhea and nausea.  Genitourinary:  Positive for dysuria, frequency and urgency. Negative for decreased urine volume, flank pain, hematuria, menstrual problem and pelvic pain.  Musculoskeletal:  Negative for arthralgias and joint swelling.  Skin:  Negative for rash.  Neurological:  Negative for dizziness and  numbness.   Objective:  BP 134/72    Pulse 76    Temp (!) 97.5 F (36.4 C)    Ht 5\' 4"  (1.626 m)    Wt 101 lb (45.8 kg)    SpO2 95%    BMI 17.34 kg/m   BP Readings from Last 3 Encounters:  03/02/21 134/72  02/24/21 139/87  04/15/20 (!) 145/75    Wt Readings from Last 3 Encounters:  03/02/21 101 lb (45.8 kg)  02/24/21 102 lb (46.3 kg)  04/15/20 106 lb (48.1 kg)     Physical Exam Constitutional:      General: She is not in acute distress.    Appearance: She is well-developed.  Cardiovascular:     Rate and Rhythm: Normal rate and regular rhythm.  Pulmonary:     Breath sounds: Normal breath sounds.  Abdominal:     Tenderness: There is no abdominal tenderness.  Musculoskeletal:        General: Normal range of motion.  Skin:    General: Skin is warm and dry.  Neurological:     Mental Status: She is alert and oriented to person, place, and time.      Assessment & Plan:   Kieara was seen today for cystitis.  Diagnoses and all orders for this visit:  Cystitis -     Urinalysis, Complete -     Urine Culture  Other orders -     sulfamethoxazole-trimethoprim (BACTRIM DS) 800-160 MG tablet; Take 1 tablet by mouth 2 (two) times daily.  I am having Neyda A. Trevor start on sulfamethoxazole-trimethoprim. I am also having her maintain her aspirin, alendronate, atorvastatin, nitroGLYCERIN, azelastine, albuterol, and benzonatate.  Allergies as of 03/02/2021   No Known Allergies      Medication List        Accurate as of March 02, 2021 11:52 AM. If you have any questions, ask your nurse or doctor.          albuterol 108 (90 Base) MCG/ACT inhaler Commonly known as: VENTOLIN HFA Inhale 2 puffs into the lungs every 6 (six) hours as needed for wheezing or shortness of breath.   alendronate 70 MG tablet Commonly known as: FOSAMAX Take 1 tablet (70 mg total) by mouth every 7 (seven) days. Take with a full glass of water on an empty stomach.   aspirin 81  MG EC tablet Take 1 tablet (81 mg total) by mouth daily.   atorvastatin 40 MG tablet Commonly known as: LIPITOR Take 1 tablet (40 mg total) by mouth daily.   azelastine 0.1 % nasal spray Commonly known as: ASTELIN Place 1 spray into both nostrils 2 (two) times daily.   benzonatate 100 MG capsule Commonly known as: Tessalon Perles Take 1 capsule (100 mg total) by mouth 3 (three) times daily as needed.   nitroGLYCERIN 0.4 MG SL tablet Commonly known as: NITROSTAT DISSOLVE ONE TABLET UNDER THE TONGUE EVERY 5 MINUTES AS NEEDED FOR CHEST PAIN.  DO NOT EXCEED A TOTAL OF 3 DOSES IN 15 MINUTES. Put on file   sulfamethoxazole-trimethoprim 800-160 MG tablet Commonly known as: BACTRIM DS Take 1 tablet by mouth 2 (two) times daily. Started by: Claretta Fraise, MD         Follow-up: Return if symptoms worsen or fail to improve.  Claretta Fraise, M.D.

## 2021-03-03 LAB — URINE CULTURE

## 2021-07-10 ENCOUNTER — Ambulatory Visit (INDEPENDENT_AMBULATORY_CARE_PROVIDER_SITE_OTHER): Payer: Medicare HMO | Admitting: Family Medicine

## 2021-07-10 ENCOUNTER — Encounter: Payer: Self-pay | Admitting: Family Medicine

## 2021-07-10 VITALS — BP 146/76 | HR 66 | Temp 95.8°F | Ht 64.0 in | Wt 100.2 lb

## 2021-07-10 DIAGNOSIS — R8290 Unspecified abnormal findings in urine: Secondary | ICD-10-CM

## 2021-07-10 DIAGNOSIS — N3001 Acute cystitis with hematuria: Secondary | ICD-10-CM

## 2021-07-10 DIAGNOSIS — R300 Dysuria: Secondary | ICD-10-CM

## 2021-07-10 LAB — URINALYSIS, ROUTINE W REFLEX MICROSCOPIC
Bilirubin, UA: NEGATIVE
Glucose, UA: NEGATIVE
Ketones, UA: NEGATIVE
Nitrite, UA: POSITIVE — AB
Protein,UA: NEGATIVE
Specific Gravity, UA: 1.01 (ref 1.005–1.030)
Urobilinogen, Ur: 0.2 mg/dL (ref 0.2–1.0)
pH, UA: 5.5 (ref 5.0–7.5)

## 2021-07-10 LAB — MICROSCOPIC EXAMINATION

## 2021-07-10 MED ORDER — CEFDINIR 300 MG PO CAPS: 300.0000 mg | ORAL_CAPSULE | Freq: Two times a day (BID) | ORAL | 0 refills | Status: AC

## 2021-07-10 NOTE — Progress Notes (Signed)
Assessment & Plan:  1. Acute cystitis with hematuria Education provided on UTIs. Encouraged adequate hydration.  - cefdinir (OMNICEF) 300 MG capsule; Take 1 capsule (300 mg total) by mouth 2 (two) times daily for 7 days.  Dispense: 14 capsule; Refill: 0  2. Dysuria - Urinalysis, Routine w reflex microscopic - Urine dipstick shows positive for RBC's, positive for nitrates, and positive for leukocytes.  Micro exam: 6-10 WBC's per HPF, 0-3 RBC's per HPF, and few bacteria.   3. Abnormal urinalysis - Urine Culture   Follow up plan: Return if symptoms worsen or fail to improve.  Deliah Boston, MSN, APRN, FNP-C [facility]  Subjective:   Patient ID: Kristin Trujillo, female    DOB: 1950/04/21, 71 y.o.   MRN: [id number]  HPI: Kristin Trujillo is a 71 y.o. female presenting on 07/10/2021 for Dysuria (X 2 days- OTC AZO)  Patient complains of dysuria, frequency, and urgency. She has had symptoms for 2 days. PPatient denies fever. Patient does have a history of recurrent UTI.  Patient does not have a history of pyelonephritis. She has been taking AZO.   ROS: Negative unless specifically indicated above in HPI.   Relevant past medical history reviewed and updated as indicated.   Allergies and medications reviewed and updated.   Current Outpatient Medications:    albuterol (VENTOLIN HFA) 108 (90 Base) MCG/ACT inhaler, Inhale 2 puffs into the lungs every 6 (six) hours as needed for wheezing or shortness of breath., Disp: 8 g, Rfl: 0   alendronate (FOSAMAX) 70 MG tablet, Take 1 tablet (70 mg total) by mouth every 7 (seven) days. Take with a full glass of water on an empty stomach., Disp: 12 tablet, Rfl: 3   aspirin EC 81 MG EC tablet, Take 1 tablet (81 mg total) by mouth daily., Disp: , Rfl:    atorvastatin (LIPITOR) 40 MG tablet, Take 1 tablet (40 mg total) by mouth daily., Disp: 90 tablet, Rfl: 3   azelastine (ASTELIN) 0.1 % nasal spray, Place 1 spray into both nostrils 2  (two) times daily., Disp: 30 mL, Rfl: 12   nitroGLYCERIN (NITROSTAT) 0.4 MG SL tablet, DISSOLVE ONE TABLET UNDER THE TONGUE EVERY 5 MINUTES AS NEEDED FOR CHEST PAIN.  DO NOT EXCEED A TOTAL OF 3 DOSES IN 15 MINUTES. Put on file, Disp: 25 tablet, Rfl: 1  No Known Allergies  Objective:   BP (!) 146/76   Pulse 66   Temp (!) 95.8 F (35.4 C) (Temporal)   Ht 5\' 4"  (1.626 m)   Wt 100 lb 3.2 oz (45.5 kg)   SpO2 100%   BMI 17.20 kg/m    Physical Exam Vitals reviewed.  Constitutional:      General: She is not in acute distress.    Appearance: Normal appearance. She is not ill-appearing, toxic-appearing or diaphoretic.  HENT:     Head: Normocephalic and atraumatic.  Eyes:     General: No scleral icterus.       Right eye: No discharge.        Left eye: No discharge.     Conjunctiva/sclera: Conjunctivae normal.  Cardiovascular:     Rate and Rhythm: Normal rate.  Pulmonary:     Effort: Pulmonary effort is normal. No respiratory distress.  Abdominal:     Tenderness: There is no right CVA tenderness or left CVA tenderness.  Musculoskeletal:        General: Normal range of motion.     Cervical back: Normal range  of motion.  Skin:    General: Skin is warm and dry.     Capillary Refill: Capillary refill takes less than 2 seconds.  Neurological:     General: No focal deficit present.     Mental Status: She is alert and oriented to person, place, and time. Mental status is at baseline.  Psychiatric:        Mood and Affect: Mood normal.        Behavior: Behavior normal.        Thought Content: Thought content normal.        Judgment: Judgment normal.

## 2021-07-12 LAB — URINE CULTURE

## 2021-08-14 ENCOUNTER — Ambulatory Visit (INDEPENDENT_AMBULATORY_CARE_PROVIDER_SITE_OTHER): Payer: Medicare HMO | Admitting: Family Medicine

## 2021-08-14 ENCOUNTER — Encounter: Payer: Self-pay | Admitting: Family Medicine

## 2021-08-14 VITALS — BP 137/72 | HR 79 | Temp 97.8°F | Ht 64.0 in | Wt 100.4 lb

## 2021-08-14 DIAGNOSIS — R110 Nausea: Secondary | ICD-10-CM | POA: Diagnosis not present

## 2021-08-14 DIAGNOSIS — J069 Acute upper respiratory infection, unspecified: Secondary | ICD-10-CM | POA: Diagnosis not present

## 2021-08-14 DIAGNOSIS — B9689 Other specified bacterial agents as the cause of diseases classified elsewhere: Secondary | ICD-10-CM | POA: Diagnosis not present

## 2021-08-14 DIAGNOSIS — J208 Acute bronchitis due to other specified organisms: Secondary | ICD-10-CM

## 2021-08-14 MED ORDER — IPRATROPIUM-ALBUTEROL 0.5-2.5 (3) MG/3ML IN SOLN
3.0000 mL | Freq: Once | RESPIRATORY_TRACT | Status: AC
  Administered 2021-08-14: 3 mL via RESPIRATORY_TRACT

## 2021-08-14 MED ORDER — ONDANSETRON 4 MG PO TBDP: 4.0000 mg | ORAL_TABLET | Freq: Three times a day (TID) | ORAL | 0 refills | Status: DC | PRN

## 2021-08-14 MED ORDER — PREDNISONE 20 MG PO TABS: ORAL_TABLET | ORAL | 0 refills | Status: DC

## 2021-08-14 MED ORDER — ALBUTEROL SULFATE HFA 108 (90 BASE) MCG/ACT IN AERS: 2.0000 | INHALATION_SPRAY | Freq: Four times a day (QID) | RESPIRATORY_TRACT | 0 refills | Status: DC | PRN

## 2021-08-14 MED ORDER — METHYLPREDNISOLONE ACETATE 40 MG/ML IJ SUSP
40.0000 mg | Freq: Once | INTRAMUSCULAR | Status: AC
  Administered 2021-08-14: 40 mg via INTRAMUSCULAR

## 2021-08-14 MED ORDER — DOXYCYCLINE HYCLATE 100 MG PO TABS: 100.0000 mg | ORAL_TABLET | Freq: Two times a day (BID) | ORAL | 0 refills | Status: AC

## 2021-08-14 NOTE — Progress Notes (Signed)
Subjective: CC: URI PCP: Raliegh Ip, DO ACZ:YSAYT A Clink is a 71 y.o. female presenting to clinic today for:  1.  URI Patient reports onset over the weekend.  She initially thought this may be allergies because she been outside all day but notes that it is progressed such that she is having a productive cough, chest congestion, copious drainage and sinus pressure.  She reports that the drainage is so bad that she becomes nauseated.  She has not had anything to eat or drink today because of the nausea.  No fevers reported.  No known sick contacts.  Utilizing her Astelin but discontinued this a few days ago because she felt like her symptoms were worse.   ROS: Per HPI  No Known Allergies Past Medical History:  Diagnosis Date   Anxiety    CAD (coronary artery disease)    a. 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 Take with a full glass of water on an empty stomach., Disp: 12 tablet, Rfl: 3   aspirin EC 81 MG EC tablet, Take 1 tablet (81 mg total) by mouth daily., Disp: , Rfl:    atorvastatin (LIPITOR) 40 MG tablet, Take 1 tablet (40 mg total) by mouth daily., Disp: 90 tablet, Rfl: 3   azelastine (ASTELIN) 0.1 % nasal spray, Place 1 spray into both nostrils 2 (two) times daily., Disp: 30 mL, Rfl: 12   nitroGLYCERIN (NITROSTAT) 0.4 MG SL tablet, DISSOLVE ONE TABLET UNDER THE TONGUE EVERY 5 MINUTES AS NEEDED FOR CHEST PAIN.  DO NOT EXCEED A TOTAL OF 3 DOSES IN 15 MINUTES. Put on file, Disp: 25 tablet, Rfl: 1 Social History   Socioeconomic History   Marital status: Widowed    Spouse name: Not on file    Number of children: 3   Years of education: Not on file   Highest education level: Not on file  Occupational History   Not on file  Tobacco Use   Smoking status: Every Day    Packs/day: 0.50    Years: 50.00    Total pack years: 25.00    Types: Cigarettes   Smokeless tobacco: Never  Vaping Use   Vaping Use: Never used  Substance and Sexual Activity   Alcohol use: No   Drug use: No   Sexual activity: Not Currently  Other Topics Concern   Not on file  Social History Narrative   Patient is widowed.  Her husband passed away 05-26-2011 from colon cancer   Her middle son also passed away from some type of neuroendocrine cancer   She has 2 living sons.  Her eldest typically brings her to appointments.   She is an active every day smoker   She has dogs   She resides alone   Eats very healthy and prepares all meals   Social Determinants of Health   Financial Resource Strain: Not on file  Food Insecurity: Not on file  Transportation Needs: Not on file  Physical Activity: Not on file  Stress: Not on file  Social Connections:  Not on file  Intimate Partner Violence: Not on file   Family History  Problem Relation Age of Onset   Hyperlipidemia Mother    Cancer Father    Cancer Son        neuroendocrine cancer    Objective: Office vital signs reviewed. BP (!) 141/83   Pulse 79   Temp 97.8 F (36.6 C)   Ht 5\' 4"  (1.626 m)   Wt 100 lb 6.4 oz (45.5 kg)   SpO2 94%   BMI 17.23 kg/m   Physical Examination:  General: Awake, alert, thin female, No acute distress HEENT: Normal    Neck: No masses palpated. No lymphadenopathy    Ears: Tympanic membranes intact, normal light reflex, no erythema, no bulging    Eyes: PERRLA, extraocular membranes intact, sclera white    Nose: nasal turbinates moist, clear nasal discharge    Throat: moist mucus membranes, mild oropharyngeal erythema, no tonsillar exudate.  Airway is patent Cardio: regular rate and rhythm, S1S2 heard, no murmurs  appreciated Pulm: Globally decreased breath sounds without wheezes.  Coughing harshly on exam.   Assessment/ Plan: 71 y.o. female   Acute bacterial bronchitis - Plan: Novel Coronavirus, NAA ([facility]), ipratropium-albuterol (DUONEB) 0.5-2.5 (3) MG/3ML nebulizer solution 3 mL, methylPREDNISolone acetate (DEPO-MEDROL) injection 40 mg, doxycycline (VIBRA-TABS) 100 MG tablet, predniSONE (DELTASONE) 20 MG tablet, albuterol (VENTOLIN HFA) 108 (90 Base) MCG/ACT inhaler  Nausea - Plan: ondansetron (ZOFRAN-ODT) 4 MG disintegrating tablet  Acute bacterial bronchitis.  Albuterol renewed.  Depo shot administered.  Start prednisone tomorrow.  Doxycycline p.o. twice daily for the next 7 days.  Take with food.  DuoNeb administered here in office.  Orders Placed This Encounter  Procedures   Novel Coronavirus, NAA ([facility])    Order Specific Question:   Previously tested for COVID-19    Answer:   No    Order Specific Question:   Resident in a congregate (group) care setting    Answer:   No    Order Specific Question:   Is the patient student?    Answer:   No    Order Specific Question:   Employed in healthcare setting    Answer:   No    Order Specific Question:   Pregnant    Answer:   No    Order Specific Question:   Has patient completed COVID vaccination(s) (2 doses of Pfizer/Moderna 1 dose of 78)    Answer:   No   Meds ordered this encounter  Medications   ipratropium-albuterol (DUONEB) 0.5-2.5 (3) MG/3ML nebulizer solution 3 mL   methylPREDNISolone acetate (DEPO-MEDROL) injection 40 mg   doxycycline (VIBRA-TABS) 100 MG tablet    Sig: Take 1 tablet (100 mg total) by mouth 2 (two) times daily for 7 days.    Dispense:  14 tablet    Refill:  0   predniSONE (DELTASONE) 20 MG tablet    Sig: 2 po at same time with breakfast daily for 3 days. START SATURDAY AM    Dispense:  6 tablet    Refill:  0   albuterol (VENTOLIN HFA) 108 (90 Base) MCG/ACT inhaler    Sig: Inhale 2 puffs into  the lungs every 6 (six) hours as needed for wheezing or shortness of breath.    Dispense:  8 g    Refill:  0   ondansetron (ZOFRAN-ODT) 4 MG disintegrating tablet    Sig: Take 1 tablet (4 mg total) by mouth every 8 (eight) hours as needed for nausea  or vomiting.    Dispense:  20 tablet    Refill:  0     Avrey Flanagin Hulen Skains, DO [facility] [phone]

## 2021-08-14 NOTE — Patient Instructions (Signed)

## 2021-08-16 LAB — NOVEL CORONAVIRUS, NAA: SARS-CoV-2, NAA: NOT DETECTED

## 2021-08-29 ENCOUNTER — Other Ambulatory Visit: Payer: Self-pay | Admitting: Family Medicine

## 2021-08-29 DIAGNOSIS — B9689 Other specified bacterial agents as the cause of diseases classified elsewhere: Secondary | ICD-10-CM

## 2021-10-10 ENCOUNTER — Emergency Department (HOSPITAL_BASED_OUTPATIENT_CLINIC_OR_DEPARTMENT_OTHER)
Admission: EM | Admit: 2021-10-10 | Discharge: 2021-10-10 | Disposition: A | Discharge status: Home / Self Care | Payer: Medicare HMO | Attending: Emergency Medicine | Admitting: Emergency Medicine

## 2021-10-10 ENCOUNTER — Emergency Department (HOSPITAL_BASED_OUTPATIENT_CLINIC_OR_DEPARTMENT_OTHER): Payer: Medicare HMO

## 2021-10-10 ENCOUNTER — Other Ambulatory Visit: Payer: Self-pay

## 2021-10-10 DIAGNOSIS — R69 Illness, unspecified: Secondary | ICD-10-CM | POA: Diagnosis not present

## 2021-10-10 DIAGNOSIS — F17200 Nicotine dependence, unspecified, uncomplicated: Secondary | ICD-10-CM | POA: Insufficient documentation

## 2021-10-10 DIAGNOSIS — U071 COVID-19: Secondary | ICD-10-CM | POA: Insufficient documentation

## 2021-10-10 DIAGNOSIS — R0602 Shortness of breath: Secondary | ICD-10-CM | POA: Diagnosis not present

## 2021-10-10 MED ORDER — IPRATROPIUM-ALBUTEROL 0.5-2.5 (3) MG/3ML IN SOLN
RESPIRATORY_TRACT | Status: AC
  Administered 2021-10-10: 3 mL via RESPIRATORY_TRACT
  Filled 2021-10-10: qty 3

## 2021-10-10 MED ORDER — ALBUTEROL SULFATE (2.5 MG/3ML) 0.083% IN NEBU: 2.5000 mg | INHALATION_SOLUTION | Freq: Once | RESPIRATORY_TRACT | Status: AC

## 2021-10-10 MED ORDER — IPRATROPIUM-ALBUTEROL 0.5-2.5 (3) MG/3ML IN SOLN: 3.0000 mL | Freq: Once | RESPIRATORY_TRACT | Status: AC

## 2021-10-10 MED ORDER — ALBUTEROL SULFATE HFA 108 (90 BASE) MCG/ACT IN AERS
2.0000 | INHALATION_SPRAY | RESPIRATORY_TRACT | Status: DC | PRN
Start: 2021-10-10 — End: 2021-10-10

## 2021-10-10 MED ORDER — ALBUTEROL SULFATE HFA 108 (90 BASE) MCG/ACT IN AERS: 1.0000 | INHALATION_SPRAY | Freq: Four times a day (QID) | RESPIRATORY_TRACT | 0 refills | Status: DC | PRN

## 2021-10-10 MED ORDER — ALBUTEROL SULFATE (2.5 MG/3ML) 0.083% IN NEBU
INHALATION_SOLUTION | RESPIRATORY_TRACT | Status: AC
  Administered 2021-10-10: 2.5 mg via RESPIRATORY_TRACT
  Filled 2021-10-10: qty 3

## 2021-10-10 MED ORDER — NIRMATRELVIR/RITONAVIR (PAXLOVID)TABLET: 3.0000 | ORAL_TABLET | Freq: Two times a day (BID) | ORAL | 0 refills | Status: AC

## 2021-10-10 NOTE — ED Notes (Signed)
Dc instructions reviewed with patient. Patient voiced understanding. Dc with belongings.  °

## 2021-10-10 NOTE — Discharge Instructions (Signed)
You were seen today for shortness of breath.  Your evaluation is most consistent with a mild COPD exacerbation in the setting of COVID infection.  We will prescribe you Paxlovid and recommend you use the albuterol inhaler as needed.  Please follow-up with your primary care provider within 48 hours for both discussions about your ongoing care regarding COVID and for discussions regarding low-dose CT scan for screening for outpatient lung cancer I do believe you would meet criteria for.

## 2021-10-10 NOTE — ED Provider Notes (Signed)
[facility] EMERGENCY DEPT Provider Note   CSN: [id number] Arrival date & time: 10/10/21  0849     History Chief Complaint  Patient presents with   Shortness of Breath    HPI Kristin Trujillo is a 71 y.o. female presenting for shortness of breath.  She endorses 5 days of fever fatigue chills and cough.  Yesterday she felt progressively worse and her son recommended a COVID test which was positive.  She is otherwise ambulatory tolerating p.o. intake at this time.  Treated in triage with a breathing treatment and grossly symptomatically improved by the time I met the patient.  She endorses that she feels comfortable going home based on this improvement. She endorses a 60-pack-year smoking history..   Patient's recorded medical, surgical, social, medication list and allergies were reviewed in the Snapshot window as part of the initial history.   Review of Systems   Review of Systems  Constitutional:  Negative for chills and fever.  HENT:  Negative for ear pain and sore throat.   Eyes:  Negative for pain and visual disturbance.  Respiratory:  Positive for cough and shortness of breath.   Cardiovascular:  Negative for chest pain and palpitations.  Gastrointestinal:  Negative for abdominal pain and vomiting.  Genitourinary:  Negative for dysuria and hematuria.  Musculoskeletal:  Negative for arthralgias and back pain.  Skin:  Negative for color change and rash.  Neurological:  Negative for seizures and syncope.  All other systems reviewed and are negative.   Physical Exam Updated Vital Signs BP 134/74   Pulse 80   Temp (!) 97.5 F (36.4 C) (Oral)   Resp (!) 26   SpO2 93%  Physical Exam Vitals and nursing note reviewed.  Constitutional:      General: She is not in acute distress.    Appearance: She is well-developed.  HENT:     Head: Normocephalic and atraumatic.  Eyes:     Conjunctiva/sclera: Conjunctivae normal.  Cardiovascular:     Rate and Rhythm: Normal  rate and regular rhythm.     Heart sounds: No murmur heard. Pulmonary:     Effort: Pulmonary effort is normal. No respiratory distress.     Breath sounds: Wheezing present.  Abdominal:     Palpations: Abdomen is soft.     Tenderness: There is no abdominal tenderness.  Musculoskeletal:        General: No swelling.     Cervical back: Neck supple.  Skin:    General: Skin is warm and dry.     Capillary Refill: Capillary refill takes less than 2 seconds.  Neurological:     Mental Status: She is alert.  Psychiatric:        Mood and Affect: Mood normal.      ED Course/ Medical Decision Making/ A&P    Procedures Procedures   Medications Ordered in ED Medications  albuterol (VENTOLIN HFA) 108 (90 Base) MCG/ACT inhaler 2 puff (has no administration in time range)  ipratropium-albuterol (DUONEB) 0.5-2.5 (3) MG/3ML nebulizer solution 3 mL (3 mLs Nebulization Given 10/10/21 0912)  albuterol (PROVENTIL) (2.5 MG/3ML) 0.083% nebulizer solution 2.5 mg (2.5 mg Nebulization Given 10/10/21 2440)    Medical Decision Making:    Kristin Trujillo is a 71 y.o. female who presented to the ED today with shortness of breath detailed above.     Additional history discussed with patient's family/caregivers.  Patient's presentation is complicated by their history of extensive smoking history, history of MI.  Patient placed  on continuous vitals and telemetry monitoring while in ED which was reviewed periodically.   Complete initial physical exam performed, notably the patient  was hemodynamically stable in no acute distress.  She initially had wheezing per RT though this has improved after administration of DuoNeb therapy.      Reviewed and confirmed nursing documentation for past medical history, family history, social history.    Initial Assessment:   With the patient's presentation of shortness of breath and wheezing, most likely diagnosis is COPD exacerbation secondary to COVID infection. Other  diagnoses were considered including (but not limited to) pneumonia, pneumothorax. These are considered less likely due to history of present illness and physical exam findings.   This is most consistent with an acute life/limb threatening illness complicated by underlying chronic conditions.  Initial Plan:  Empiric treatment with adult wees protocol including albuterol, ipratropium combination treatment with plan for reassessment of patient's symptoms. Given positive COVID test, patient would be a candidate for Paxlovid therapy CXR to evaluate for structural/infectious intrathoracic pathology.  EKG to evaluate for cardiac pathology. Objective evaluation as below reviewed with plan for close reassessment  Initial Study Results:   EKG EKG was reviewed independently. Rate, rhythm, axis, intervals all examined and without medically relevant abnormality. ST segments without concerns for elevations.    Radiology  All images reviewed independently. Agree with radiology report at this time.   DG Chest Portable 1 View  Result Date: 10/10/2021 CLINICAL DATA:  71 year old female with shortness of breath. EXAM: PORTABLE CHEST 1 VIEW COMPARISON:  None Available. FINDINGS: The cardiomediastinal silhouette is unremarkable. Mild hyperinflation noted. There is no evidence of focal airspace disease, pulmonary edema, suspicious pulmonary nodule/mass, pleural effusion, or pneumothorax. No acute bony abnormalities are identified. IMPRESSION: Mild hyperinflation without other significant abnormality. Electronically Signed   By: Margarette Canada M.D.   On: 10/10/2021 09:47     Final Assessment and Plan:   On reassessment, patient feels symptomatically improved after treatments.  We will treat patient with albuterol inhaler and Paxlovid in the outpatient setting recommend she follow-up with PCP within 72 hours for reassessment   Patient ambulatory tolerating p.o. intake in no acute distress stable for outpatient care and  management.  Clinical Impression:  1. SOB (shortness of breath)      Discharge   Final Clinical Impression(s) / ED Diagnoses Final diagnoses:  SOB (shortness of breath)    Rx / DC Orders ED Discharge Orders          Ordered    nirmatrelvir/ritonavir EUA (PAXLOVID) 20 x 150 MG & 10 x 100MG TABS  2 times daily        10/10/21 1011    albuterol (VENTOLIN HFA) 108 (90 Base) MCG/ACT inhaler  Every 6 hours PRN        10/10/21 1011              Tretha Sciara, MD 10/10/21 1223

## 2021-10-10 NOTE — ED Triage Notes (Signed)
Pt had bronchitis a month ago.

## 2021-10-10 NOTE — ED Triage Notes (Signed)
Pt states she tested positive for COVID today, symptoms started Sunday, she is extremely short of breath and called son. In exam she is extremely short of breath at rest. O2 sats 96%.

## 2021-10-15 ENCOUNTER — Telehealth: Payer: Self-pay | Admitting: Family Medicine

## 2021-10-15 ENCOUNTER — Emergency Department (HOSPITAL_COMMUNITY)
Admission: EM | Admit: 2021-10-15 | Discharge: 2021-10-15 | Disposition: A | Discharge status: Home / Self Care | Payer: Medicare HMO | Attending: Emergency Medicine | Admitting: Emergency Medicine

## 2021-10-15 ENCOUNTER — Other Ambulatory Visit: Payer: Self-pay

## 2021-10-15 ENCOUNTER — Encounter (HOSPITAL_COMMUNITY): Payer: Self-pay

## 2021-10-15 ENCOUNTER — Emergency Department (HOSPITAL_COMMUNITY): Payer: Medicare HMO

## 2021-10-15 DIAGNOSIS — R0602 Shortness of breath: Secondary | ICD-10-CM

## 2021-10-15 DIAGNOSIS — R918 Other nonspecific abnormal finding of lung field: Secondary | ICD-10-CM | POA: Diagnosis not present

## 2021-10-15 DIAGNOSIS — U071 COVID-19: Secondary | ICD-10-CM | POA: Diagnosis not present

## 2021-10-15 DIAGNOSIS — Z7982 Long term (current) use of aspirin: Secondary | ICD-10-CM | POA: Insufficient documentation

## 2021-10-15 DIAGNOSIS — R000 Tachycardia, unspecified: Secondary | ICD-10-CM | POA: Diagnosis not present

## 2021-10-15 DIAGNOSIS — J984 Other disorders of lung: Secondary | ICD-10-CM | POA: Diagnosis not present

## 2021-10-15 DIAGNOSIS — Z743 Need for continuous supervision: Secondary | ICD-10-CM | POA: Diagnosis not present

## 2021-10-15 MED ORDER — IPRATROPIUM-ALBUTEROL 0.5-2.5 (3) MG/3ML IN SOLN
3.0000 mL | Freq: Once | RESPIRATORY_TRACT | Status: AC
  Administered 2021-10-15: 3 mL via RESPIRATORY_TRACT
  Filled 2021-10-15: qty 3

## 2021-10-15 MED ORDER — ALBUTEROL SULFATE (2.5 MG/3ML) 0.083% IN NEBU: 2.5000 mg | INHALATION_SOLUTION | Freq: Four times a day (QID) | RESPIRATORY_TRACT | 12 refills | Status: AC | PRN

## 2021-10-15 MED ORDER — SODIUM CHLORIDE 0.9 % IV BOLUS
1000.0000 mL | Freq: Once | INTRAVENOUS | Status: AC
  Administered 2021-10-15: 1000 mL via INTRAVENOUS

## 2021-10-15 NOTE — ED Triage Notes (Signed)
Pt dx of Covid on Fri and pt went to ED and got paflovid and inhaler. Pt has last dose to take this  morning. Pt states she still is tired, low po intake and has diarrhea. Pt thought the medication would have made her feel better by now.

## 2021-10-15 NOTE — Discharge Instructions (Addendum)
Please follow-up with pulmonology for further evaluation.  As we discussed, there are no findings of pneumonia.  Please get a nebulizer machine from your primary care doctor.  I have sent some nebulizer solution to the pharmacy.  Please return to the emergency department for any worsening symptoms you might have.

## 2021-10-15 NOTE — Telephone Encounter (Signed)
Yes he can go ahead and do a prescription for a nebulizer machine for her, diagnosis COVID infection

## 2021-10-15 NOTE — ED Provider Notes (Signed)
[facility] EMERGENCY DEPARTMENT Provider Note   CSN: [id number] Arrival date & time: 10/15/21  2952     History Chief Complaint  Patient presents with   Weakness    Kristin Trujillo is a 71 y.o. female patient with a history of tobacco abuse who presents to the emergency department with shortness of breath been ongoing for the last 4 to 5 days.  Patient was seen and evaluated in the emergency department on 10/10/2021 after having a COVID test that came back positive at home.  Chest x-ray and labs were all reassuring.  No signs of pneumonia at that time.  She was given an albuterol treatment with improvement.  She was discharged with Paxlovid which she is taken and she feels no better which prompted her arrival today.  She reports associated decreased p.o. intake both with solids and liquids.  Shortness of breath seems to be about the same.  She denies any fever, chills, abdominal pain, nausea, vomiting, diarrhea.   Weakness      Home Medications Prior to Admission medications   Medication Sig Start Date End Date Taking? Authorizing Provider  albuterol (PROVENTIL) (2.5 MG/3ML) 0.083% nebulizer solution Take 3 mLs (2.5 mg total) by nebulization every 6 (six) hours as needed for wheezing or shortness of breath. 10/15/21  Yes Meredeth Ide, Uliana Brinker M, PA-C  albuterol (VENTOLIN HFA) 108 (90 Base) MCG/ACT inhaler Inhale 1-2 puffs into the lungs every 6 (six) hours as needed for wheezing or shortness of breath. 10/10/21   Glyn Ade, MD  alendronate (FOSAMAX) 70 MG tablet Take 1 tablet (70 mg total) by mouth every 7 (seven) days. Take with a full glass of water on an empty stomach. 02/24/21   Raliegh Ip, DO  aspirin EC 81 MG EC tablet Take 1 tablet (81 mg total) by mouth daily. 05/16/13   Janetta Hora, PA-C  atorvastatin (LIPITOR) 40 MG tablet Take 1 tablet (40 mg total) by mouth daily. 02/24/21   Raliegh Ip, DO  azelastine (ASTELIN) 0.1 % nasal spray Place 1 spray into both  nostrils 2 (two) times daily. 02/24/21   Raliegh Ip, DO  nirmatrelvir/ritonavir EUA (PAXLOVID) 20 x 150 MG & 10 x 100MG  TABS Take 3 tablets by mouth 2 (two) times daily for 5 days. Take nirmatrelvir (150 mg) two tablets twice daily for 5 days and ritonavir (100 mg) one tablet twice daily for 5 days. 10/10/21 10/15/21  10/17/21, MD  nitroGLYCERIN (NITROSTAT) 0.4 MG SL tablet DISSOLVE ONE TABLET UNDER THE TONGUE EVERY 5 MINUTES AS NEEDED FOR CHEST PAIN.  DO NOT EXCEED A TOTAL OF 3 DOSES IN 15 MINUTES. Put on file 02/24/21   02/26/21, DO  ondansetron (ZOFRAN-ODT) 4 MG disintegrating tablet Take 1 tablet (4 mg total) by mouth every 8 (eight) hours as needed for nausea or vomiting. 08/14/21   08/16/21 M, DO  predniSONE (DELTASONE) 20 MG tablet 2 po at same time with breakfast daily for 3 days. START SATURDAY AM 08/14/21   08/16/21, DO      Allergies    Patient has no known allergies.    Review of Systems   Review of Systems  Neurological:  Positive for weakness.  All other systems reviewed and are negative.   Physical Exam Updated Vital Signs BP 115/80   Pulse 77   Temp 98.3 F (36.8 C) (Oral)   Resp 18   Ht 5\' 6"  (1.676 m)   Wt 44.5 kg  SpO2 94%   BMI 15.82 kg/m  Physical Exam Vitals and nursing note reviewed.  Constitutional:      General: She is not in acute distress.    Appearance: Normal appearance.  HENT:     Head: Normocephalic and atraumatic.  Eyes:     General:        Right eye: No discharge.        Left eye: No discharge.  Cardiovascular:     Comments: Regular rate and rhythm.  S1/S2 are distinct without any evidence of murmur, rubs, or gallops.  Radial pulses are 2+ bilaterally.  Dorsalis pedis pulses are 2+ bilaterally.  No evidence of pedal edema. Pulmonary:     Comments: Clear to auscultation bilaterally.  Normal effort.  No respiratory distress.  No evidence of wheezes, rales, or rhonchi heard throughout. Abdominal:      General: Abdomen is flat. Bowel sounds are normal. There is no distension.     Tenderness: There is no abdominal tenderness. There is no guarding or rebound.  Musculoskeletal:        General: Normal range of motion.     Cervical back: Neck supple.  Skin:    General: Skin is warm and dry.     Findings: No rash.  Neurological:     General: No focal deficit present.     Mental Status: She is alert.  Psychiatric:        Mood and Affect: Mood normal.        Behavior: Behavior normal.     ED Results / Procedures / Treatments   Labs (all labs ordered are listed, but only abnormal results are displayed) Labs Reviewed - No data to display  EKG None  Radiology DG Chest [facility] 1 View  Result Date: 10/15/2021 CLINICAL DATA:  History of COVID. EXAM: PORTABLE CHEST 1 VIEW COMPARISON:  October 10, 2021. FINDINGS: The heart size and mediastinal contours are within normal limits. Hyperinflation of the lungs is noted. Possible emphysematous disease is noted in the upper lobes. Stable reticular densities are noted in the lung bases which may represent scarring. No definite acute consolidation is noted. The visualized skeletal structures are unremarkable. IMPRESSION: Hyperinflation of the lungs. Possible emphysematous disease in the upper lobes with bibasilar scarring. No definite acute consolidation is noted. Electronically Signed   By: Lupita Raider M.D.   On: 10/15/2021 10:56    Procedures Procedures    Medications Ordered in ED Medications  ipratropium-albuterol (DUONEB) 0.5-2.5 (3) MG/3ML nebulizer solution 3 mL (3 mLs Nebulization Given 10/15/21 1056)  sodium chloride 0.9 % bolus 1,000 mL (0 mLs Intravenous Stopped 10/15/21 1340)    ED Course/ Medical Decision Making/ A&P Clinical Course as of 10/15/21 1349  Thu Oct 15, 2021  1109 DG Chest [place] 1 View No evidence of pneumonia but there is some evidence of emphysema.  Patient does not carry a diagnosis of emphysema or COPD.  Notified  patient of this at bedside.  I personally ordered and interpreted this study. [CF]    Clinical Course User Index [CF] Teressa Lower, PA-C                           Medical Decision Making Kristin Trujillo is a 71 y.o. female patient who presents to the emergency department today for further evaluation of shortness of breath.  I suspect this is likely still due to the positive COVID in addition to her smoking history.  There could be some underlying undiagnosed chronic lung pathology.  I will plan to get a repeat chest x-ray to further evaluate for possible developing pneumonia although this is less likely given the patient has not had any fevers, I will give her fluids, and will give her some nebulizer treatments.  Her vital signs are completely normal at this time.  She is not hypoxic.  COVID certainly predisposes her to hypercoagulability which could lead to pulmonary embolism but I have a low suspicion for that at this time.  Patient feeling better after nebulizer treatments.  X-ray was normal.  Going to refer her to pulmonology to get evaluated for possible COPD.  This could be contributing to her shortness of breath.  I will also write her prescription for nebulizer solution.  She will contact her primary care doctor for nebulizer machine.  Strict return precautions were discussed.  She is safe for discharge at this time.   Amount and/or Complexity of Data Reviewed Radiology: ordered. Decision-making details documented in ED Course.  Risk Prescription drug management.   Final Clinical Impression(s) / ED Diagnoses Final diagnoses:  Shortness of breath  COVID    Rx / DC Orders ED Discharge Orders          Ordered    albuterol (PROVENTIL) (2.5 MG/3ML) 0.083% nebulizer solution  Every 6 hours PRN        10/15/21 1345              Honor Loh [place], [place] 10/15/21 1349    Bethann Berkshire, MD 10/18/21 [phone]

## 2021-10-15 NOTE — Telephone Encounter (Signed)
Barbara Cower informed of nebulizer being sent to pharmacy.

## 2021-10-20 ENCOUNTER — Encounter: Payer: Self-pay | Admitting: Family Medicine

## 2021-10-20 ENCOUNTER — Ambulatory Visit (INDEPENDENT_AMBULATORY_CARE_PROVIDER_SITE_OTHER): Payer: Medicare HMO | Admitting: Family Medicine

## 2021-10-20 VITALS — BP 109/73 | HR 93 | Temp 97.8°F | Ht 66.0 in | Wt 99.8 lb

## 2021-10-20 DIAGNOSIS — F418 Other specified anxiety disorders: Secondary | ICD-10-CM

## 2021-10-20 DIAGNOSIS — R0602 Shortness of breath: Secondary | ICD-10-CM

## 2021-10-20 DIAGNOSIS — Z720 Tobacco use: Secondary | ICD-10-CM | POA: Diagnosis not present

## 2021-10-20 DIAGNOSIS — R69 Illness, unspecified: Secondary | ICD-10-CM | POA: Diagnosis not present

## 2021-10-20 DIAGNOSIS — Z8616 Personal history of COVID-19: Secondary | ICD-10-CM

## 2021-10-20 MED ORDER — BUSPIRONE HCL 5 MG PO TABS: 5.0000 mg | ORAL_TABLET | Freq: Three times a day (TID) | ORAL | 1 refills | Status: DC

## 2021-10-20 MED ORDER — CHANTIX STARTING MONTH PAK 0.5 MG X 11 & 1 MG X 42 PO TBPK: ORAL_TABLET | ORAL | 0 refills | Status: DC

## 2021-10-20 MED ORDER — BREZTRI AEROSPHERE 160-9-4.8 MCG/ACT IN AERO: 2.0000 | INHALATION_SPRAY | Freq: Two times a day (BID) | RESPIRATORY_TRACT | 11 refills | Status: DC

## 2021-10-20 NOTE — Patient Instructions (Addendum)
Start buspar twice daily May increase to 3 times daily if needed Referral to lung specialist in Start chantix once the anxiety is under better control Use inhaler until assessed by Pulmonology; 2puffs twice daily. Rinse mouth after each use Chronic Obstructive Pulmonary Disease  Chronic obstructive pulmonary disease (COPD) is a long-term (chronic) lung problem. When you have COPD, it is hard for air to get in and out of your lungs. Usually the condition gets worse over time, and your lungs will never return to normal. There are things you can do to keep yourself as healthy as possible. What are the causes? Smoking. This is the most common cause. Certain genes passed from parent to child (inherited). What increases the risk? Being exposed to secondhand smoke from cigarettes, pipes, or cigars. Being exposed to chemicals and other irritants, such as fumes and dust in the work environment. Having chronic lung conditions or infections. What are the signs or symptoms? Shortness of breath, especially during physical activity. A long-term cough with a large amount of thick mucus. Sometimes, the cough may not have any mucus (dry cough). Wheezing. Breathing quickly. Skin that looks gray or blue, especially in the fingers, toes, or lips. Feeling tired (fatigue). Weight loss. Chest tightness. Having infections often. Episodes when breathing symptoms become much worse (exacerbations). At the later stages of this disease, you may have swelling in the ankles, feet, or legs. How is this treated? Taking medicines. Quitting smoking, if you smoke. Rehabilitation. This includes steps to make your body work better. It may involve a team of specialists. Doing exercises. Making changes to your diet. Using oxygen. Lung surgery. Lung transplant. Comfort measures (palliative care). Follow these instructions at home: Medicines Take over-the-counter and prescription medicines only as told by your  doctor. Talk to your doctor before taking any cough or allergy medicines. You may need to avoid medicines that cause your lungs to be dry. Lifestyle If you smoke, stop smoking. Smoking makes the problem worse. Do not smoke or use any products that contain nicotine or tobacco. If you need help quitting, ask your doctor. Avoid being around things that make your breathing worse. This may include smoke, chemicals, and fumes. Stay active, but remember to rest as well. Learn and use tips on how to manage stress and control your breathing. Make sure you get enough sleep. Most adults need at least 7 hours of sleep every night. Eat healthy foods. Eat smaller meals more often. Rest before meals. Controlled breathing Learn and use tips on how to control your breathing as told by your doctor. Try: Breathing in (inhaling) through your nose for 1 second. Then, pucker your lips and breath out (exhale) through your lips for 2 seconds. Putting one hand on your belly (abdomen). Breathe in slowly through your nose for 1 second. Your hand on your belly should move out. Pucker your lips and breathe out slowly through your lips. Your hand on your belly should move in as you breathe out.  Controlled coughing Learn and use controlled coughing to clear mucus from your lungs. Follow these steps: Lean your head a little forward. Breathe in deeply. Try to hold your breath for 3 seconds. Keep your mouth slightly open while coughing 2 times. Spit any mucus out into a tissue. Rest and do the steps again 1 or 2 times as needed. General instructions Make sure you get all the shots (vaccines) that your doctor recommends. Ask your doctor about a flu shot and a pneumonia shot. Use oxygen therapy  and pulmonary rehabilitation if told by your doctor. If you need home oxygen therapy, ask your doctor if you should buy a tool to measure your oxygen level (oximeter). Make a COPD action plan with your doctor. This helps you to know  what to do if you feel worse than usual. Manage any other conditions you have as told by your doctor. Avoid going outside when it is very hot, cold, or humid. Avoid people who have a sickness you can catch (contagious). Keep all follow-up visits. Contact a doctor if: You cough up more mucus than usual. There is a change in the color or thickness of the mucus. It is harder to breathe than usual. Your breathing is faster than usual. You have trouble sleeping. You need to use your medicines more often than usual. You have trouble doing your normal activities such as getting dressed or walking around the house. Get help right away if: You have shortness of breath while resting. You have shortness of breath that stops you from: Being able to talk. Doing normal activities. Your chest hurts for longer than 5 minutes. Your skin color is more blue than usual. Your pulse oximeter shows that you have low oxygen for longer than 5 minutes. You have a fever. You feel too tired to breathe normally. These symptoms may represent a serious problem that is an emergency. Do not wait to see if the symptoms will go away. Get medical help right away. Call your local emergency services (911 in the [place] Do not drive yourself to the hospital. Summary Chronic obstructive pulmonary disease (COPD) is a long-term lung problem. The way your lungs work will never return to normal. Usually the condition gets worse over time. There are things you can do to keep yourself as healthy as possible. Take over-the-counter and prescription medicines only as told by your doctor. If you smoke, stop. Smoking makes the problem worse. This information is not intended to replace advice given to you by your health care provider. Make sure you discuss any questions you have with your health care provider. Document Revised: 11/27/2019 Document Reviewed: 11/27/2019 Elsevier Patient Education  2023 [employer].

## 2021-10-20 NOTE — Progress Notes (Signed)
Subjective: CC: ER follow-up PCP: Raliegh Ip, DO IPJ:ASNKN A Worton is a 71 y.o. female presenting to clinic today for:  1.  ER follow-up for shortness of breath Patient was seen x2 in the ER for shortness of breath.  She had tested positive by home test for COVID-19 and was started on Paxlovid.  She admits Paxlovid made her feel pretty bad.  She continued to have shortness of breath so was evaluated again and there was no evidence of secondary bacterial infection.  There is concern however that she may have COPD and they recommended that she follow-up with pulmonology on the outpatient setting for further evaluation of this.  She is an active every day smoker but is very motivated to stop smoking.  She worries about lung cancer as well because both of her parents had cancer.  She is afraid of seeing the pulmonologist because she does not want to be diagnosed with the cancer.  Continues to have intermittent episodes of shortness of breath when she wakes but she feels that this is more panic mediated   ROS: Per HPI  No Known Allergies Past Medical History:  Diagnosis Date   Anxiety    CAD (coronary artery disease)    a. 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   Depression    HLD (hyperlipidemia)    HTN (hypertension)    Tobacco abuse     Current Outpatient Medications:    albuterol (PROVENTIL) (2.5 MG/3ML) 0.083% nebulizer solution, Take 3 mLs (2.5 mg total) by nebulization every 6 (six) hours as needed for wheezing or shortness of breath., Disp: 75 mL, Rfl: 12   albuterol (VENTOLIN HFA) 108 (90 Base) MCG/ACT inhaler, Inhale 1-2 puffs into the lungs every 6 (six) hours as needed for wheezing or shortness of breath., Disp: 1 each, Rfl: 0   alendronate (FOSAMAX) 70 MG tablet, Take 1 tablet (70 mg total) by mouth every 7 (seven) days. Take with a full glass of water on an empty stomach., Disp: 12 tablet, Rfl: 3   aspirin EC 81 MG EC tablet, Take 1 tablet (81 mg total) by mouth daily.,  Disp: , Rfl:    atorvastatin (LIPITOR) 40 MG tablet, Take 1 tablet (40 mg total) by mouth daily., Disp: 90 tablet, Rfl: 3   azelastine (ASTELIN) 0.1 % nasal spray, Place 1 spray into both nostrils 2 (two) times daily., Disp: 30 mL, Rfl: 12   Budeson-Glycopyrrol-Formoterol (BREZTRI AEROSPHERE) 160-9-4.8 MCG/ACT AERO, Inhale 2 puffs into the lungs 2 (two) times daily., Disp: 10.7 g, Rfl: 11   busPIRone (BUSPAR) 5 MG tablet, Take 1 tablet (5 mg total) by mouth 3 (three) times daily., Disp: 90 tablet, Rfl: 1   nitroGLYCERIN (NITROSTAT) 0.4 MG SL tablet, DISSOLVE ONE TABLET UNDER THE TONGUE EVERY 5 MINUTES AS NEEDED FOR CHEST PAIN.  DO NOT EXCEED A TOTAL OF 3 DOSES IN 15 MINUTES. Put on file, Disp: 25 tablet, Rfl: 1   ondansetron (ZOFRAN-ODT) 4 MG disintegrating tablet, Take 1 tablet (4 mg total) by mouth every 8 (eight) hours as needed for nausea or vomiting., Disp: 20 tablet, Rfl: 0   Varenicline Tartrate, Starter, (CHANTIX STARTING MONTH PAK) 0.5 MG X 11 & 1 MG X 42 TBPK, Take 0.5 mg tablet by mouth once daily x3 days, then 0.5 mg tablet twice daily x4 days, then increase to one 1 mg tablet twice daily., Disp: 53 each, Rfl: 0 Social History   Socioeconomic History   Marital status: Widowed  Spouse name: Not on file   Number of children: 3   Years of education: Not on file   Highest education level: Not on file  Occupational History   Not on file  Tobacco Use   Smoking status: Every Day    Packs/day: 0.50    Years: 50.00    Total pack years: 25.00    Types: Cigarettes   Smokeless tobacco: Never  Vaping Use   Vaping Use: Never used  Substance and Sexual Activity   Alcohol use: No   Drug use: No   Sexual activity: Not Currently  Other Topics Concern   Not on file  Social History Narrative   Patient is widowed.  Her husband passed away 05-28-11 from colon cancer   Her middle son also passed away from some type of neuroendocrine cancer   She has 2 living sons.  Her eldest typically brings  her to appointments.   She is an active every day smoker   She has dogs   She resides alone   Eats very healthy and prepares all meals   Social Determinants of Health   Financial Resource Strain: Not on file  Food Insecurity: Not on file  Transportation Needs: Not on file  Physical Activity: Not on file  Stress: Not on file  Social Connections: Not on file  Intimate Partner Violence: Not on file   Family History  Problem Relation Age of Onset   Hyperlipidemia Mother    Cancer Father    Cancer Son        neuroendocrine cancer    Objective: Office vital signs reviewed. BP 139/88   Pulse 93   Temp 97.8 F (36.6 C)   Ht 5\' 6"  (1.676 m)   Wt 99 lb 12.8 oz (45.3 kg)   SpO2 99%   BMI 16.11 kg/m   Physical Examination:  General: Awake, alert, nontoxic female, No acute distress HEENT: Sclera white.  Moist mucous membranes Cardio: regular rate and rhythm, S1S2 heard, no murmurs appreciated Pulm: clear to auscultation bilaterally, no wheezes, rhonchi or rales; normal work of breathing on room air Psych: Tearful     08/14/2021    3:18 PM 07/10/2021   10:17 AM 03/02/2021   11:20 AM  Depression screen PHQ 2/9  Decreased Interest 0 0 0  Down, Depressed, Hopeless 0 0 0  PHQ - 2 Score 0 0 0      08/14/2021    3:18 PM 02/24/2021   10:04 AM 04/15/2020   11:08 AM  GAD 7 : Generalized Anxiety Score  Nervous, Anxious, on Edge 0 0 1  Control/stop worrying 0 0 0  Worry too much - different things 0 0 0  Trouble relaxing 0 0 0  Restless 0 0 1  Easily annoyed or irritable 0 0 1  Afraid - awful might happen 0 0 0  Total GAD 7 Score 0 0 3  Anxiety Difficulty Not difficult at all Not difficult at all Not difficult at all      Assessment/ Plan: 71 y.o. female   Personal history of COVID-19 - Plan: Ambulatory referral to Pulmonology  Shortness of breath - Plan: Ambulatory referral to Pulmonology, Budeson-Glycopyrrol-Formoterol (BREZTRI AEROSPHERE) 160-9-4.8 MCG/ACT  AERO  Anxiety about health - Plan: busPIRone (BUSPAR) 5 MG tablet  Tobacco use - Plan: Varenicline Tartrate, Starter, (CHANTIX STARTING MONTH PAK) 0.5 MG X 11 & 1 MG X 42 TBPK  Referral to pulmonology.  I also suspect COPD given history of bronchitis in the  past.  I am going to place her on Breztri.  2 boxes of samples were provided for her today.  I encouraged her to rinse mouth out after each use  BuSpar started up to 3 times daily for anxiety.  We will advance this further in 4 weeks if needed  Chantix given but I advised her to not start this until her anxiety is under better control as this can exacerbate mental health issues.  She voiced good understanding but is very motivated to stop smoking  Orders Placed This Encounter  Procedures   Ambulatory referral to Pulmonology    Referral Priority:   Routine    Referral Type:   Consultation    Referral Reason:   Specialty Services Required    Requested Specialty:   Pulmonary Disease    Number of Visits Requested:   1   Meds ordered this encounter  Medications   busPIRone (BUSPAR) 5 MG tablet    Sig: Take 1 tablet (5 mg total) by mouth 3 (three) times daily.    Dispense:  90 tablet    Refill:  1   Varenicline Tartrate, Starter, (CHANTIX STARTING MONTH PAK) 0.5 MG X 11 & 1 MG X 42 TBPK    Sig: Take 0.5 mg tablet by mouth once daily x3 days, then 0.5 mg tablet twice daily x4 days, then increase to one 1 mg tablet twice daily.    Dispense:  53 each    Refill:  0   Budeson-Glycopyrrol-Formoterol (BREZTRI AEROSPHERE) 160-9-4.8 MCG/ACT AERO    Sig: Inhale 2 puffs into the lungs 2 (two) times daily.    Dispense:  10.7 g    Refill:  11     Muna Demers Hulen Skains, DO [facility] [phone]

## 2021-10-23 ENCOUNTER — Encounter: Payer: Self-pay | Admitting: Internal Medicine

## 2021-10-23 ENCOUNTER — Ambulatory Visit: Payer: Medicare HMO | Admitting: Internal Medicine

## 2021-10-23 DIAGNOSIS — R69 Illness, unspecified: Secondary | ICD-10-CM | POA: Diagnosis not present

## 2021-10-23 DIAGNOSIS — R0602 Shortness of breath: Secondary | ICD-10-CM

## 2021-10-23 DIAGNOSIS — J449 Chronic obstructive pulmonary disease, unspecified: Secondary | ICD-10-CM

## 2021-10-23 DIAGNOSIS — F17210 Nicotine dependence, cigarettes, uncomplicated: Secondary | ICD-10-CM

## 2021-10-23 MED ORDER — BREZTRI AEROSPHERE 160-9-4.8 MCG/ACT IN AERO: INHALATION_SPRAY | RESPIRATORY_TRACT | 11 refills | Status: DC

## 2021-10-23 NOTE — Patient Instructions (Addendum)
Plan A =  Breztri Take 2 puffs first thing in am and then another 2 puffs about 12 hours later.    Work on inhaler technique:  relax and gently blow all the way out then take a nice smooth full deep breath back in, triggering the inhaler at same time you start breathing in.  Hold breath in for at least  5 seconds if you can. Blow out breztri  thru nose. Rinse and gargle with water when done.  If mouth or throat bother you at all,  try brushing teeth/gums/tongue with arm and hammer toothpaste/ make a slurry and gargle and spit out.      Plan B = Backup (to supplement plan A, not to replace it) Only use your albuterol inhaler as a rescue medication to be used if you can't catch your breath by resting or doing a relaxed purse lip breathing pattern.  - The less you use it, the better it will work when you need it. - Ok to use the inhaler up to 2 puffs  every 4 hours if you must but call for appointment if use goes up over your usual need - Don't leave home without it !!  (think of it like the spare tire for your car)   Plan C = Crisis (instead of Plan B but only if Plan B stops working) - only use your albuterol nebulizer if you first try Plan B and it fails to help > ok to use the nebulizer up to every 4 hours but if start needing it regularly call for immediate appointment    Good luck with stopping smoking   I recommend annual low dose lung cancer screening - either your PCP or thru this office  Please schedule a follow up visit in 2- 3 months but call sooner if needed with pfts

## 2021-10-23 NOTE — Progress Notes (Signed)
[name], female    DOB: November 26, 1950   MRN: [id number]   Brief patient profile:  71  yowf active smoker referred to pulmonary clinic 10/23/2021 by Kristin Trujillo for copd with onset of symptoms with covid 19 10/03/21.  Seen in ER 10/03/21  with  Kristin Trujillo  pos covid testing >>>   rx paxlovid and rx breztri/saba    History of Present Illness  10/23/2021  Pulmonary/ 1st office eval/Kristin Trujillo breztri hs only  Chief Complaint  Patient presents with   Consult    She had covid about 2 weeks ago and still feeling fatigue, and cough.    Dyspnea:  says walking thru stores fine/ walking dog ok = baseline now  Cough: none  Sleep: ok on 2 pillows SABA use: none   No obvious day to day or daytime pattern/variability or assoc excess/ purulent sputum or mucus plugs or hemoptysis or cp or chest tightness, subjective wheeze or overt sinus or hb symptoms.   Sleeping  without nocturnal  or early am exacerbation  of respiratory  c/o's or need for noct saba. Also denies any obvious fluctuation of symptoms with weather or environmental changes or other aggravating or alleviating factors except as outlined above   No unusual exposure hx or h/o childhood pna/ asthma or knowledge of premature birth.  Current Allergies, Complete Past Medical History, Past Surgical History, Family History, and Social History were reviewed in Kristin Trujillo record.  ROS  The following are not active complaints unless bolded Hoarseness, sore throat, dysphagia, dental problems, itching, sneezing,  nasal congestion or discharge of excess mucus or purulent secretions, ear ache,   fever, chills, sweats, unintended wt loss or wt gain, classically pleuritic or exertional cp,  orthopnea pnd or arm/hand swelling  or leg swelling, presyncope, palpitations, abdominal pain, anorexia, nausea, vomiting, diarrhea  or change in bowel habits or change in bladder habits, change in stools or change in urine, dysuria, hematuria,   rash, arthralgias, visual complaints, headache, numbness, weakness or ataxia or problems with walking or coordination,  change in mood or  memory.           Past Medical History:  Diagnosis Date   Anxiety    CAD (coronary artery disease)    a. 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   Depression    HLD (hyperlipidemia)    HTN (hypertension)    Tobacco abuse     Outpatient Medications Prior to Visit - - NOTE:   Unable to verify as accurately reflecting what pt takes    Medication Sig Dispense Refill   albuterol (PROVENTIL) (2.5 MG/3ML) 0.083% nebulizer solution Take 3 mLs (2.5 mg total) by nebulization every 6 (six) hours as needed for wheezing or shortness of breath. 75 mL 12   albuterol (VENTOLIN HFA) 108 (90 Base) MCG/ACT inhaler Inhale 1-2 puffs into the lungs every 6 (six) hours as needed for wheezing or shortness of breath. 1 each 0   alendronate (FOSAMAX) 70 MG tablet Take 1 tablet (70 mg total) by mouth every 7 (seven) days. Take with a full glass of water on an empty stomach. 12 tablet 3   aspirin EC 81 MG EC tablet Take 1 tablet (81 mg total) by mouth daily.     atorvastatin (LIPITOR) 40 MG tablet Take 1 tablet (40 mg total) by mouth daily. 90 tablet 3   azelastine (ASTELIN) 0.1 % nasal spray Place 1 spray into both nostrils 2 (two) times daily. 30 mL  12   Budeson-Glycopyrrol-Formoterol (BREZTRI AEROSPHERE) 160-9-4.8 MCG/ACT AERO Inhale 2 puffs into the lungs 2 (two) times daily. 10.7 g 11   busPIRone (BUSPAR) 5 MG tablet Take 1 tablet (5 mg total) by mouth 3 (three) times daily. 90 tablet 1   nitroGLYCERIN (NITROSTAT) 0.4 MG SL tablet DISSOLVE ONE TABLET UNDER THE TONGUE EVERY 5 MINUTES AS NEEDED FOR CHEST PAIN.  DO NOT EXCEED A TOTAL OF 3 DOSES IN 15 MINUTES. Put on file 25 tablet 1   ondansetron (ZOFRAN-ODT) 4 MG disintegrating tablet Take 1 tablet (4 mg total) by mouth every 8 (eight) hours as needed for nausea or vomiting. 20 tablet 0   Varenicline Tartrate, Starter, (CHANTIX  STARTING MONTH PAK) 0.5 MG X 11 & 1 MG X 42 TBPK Take 0.5 mg tablet by mouth once daily x3 days, then 0.5 mg tablet twice daily x4 days, then increase to one 1 mg tablet twice daily. 50 each 0   No facility-administered medications prior to visit.     Objective:     BP 128/80 (BP Location: Left Arm, Cuff Size: Normal)   Pulse 85   Temp 98 F (36.7 C)   Ht 5\' 6"  (1.676 m)   Wt 97 lb 9.6 oz (44.3 kg)   SpO2 98% Comment: ra  BMI 15.75 kg/m   SpO2: 98 % (ra)  HEENT : Oropharynx  clear  Nasal turbinates nl    NECK :  without  apparent JVD/ palpable Nodes/TM    LUNGS: no acc muscle use,  Mild barrel  contour chest wall with bilateral  Distant bs s audible wheeze and  without cough on insp or exp maneuvers  and mild  Hyperresonant  to  percussion bilaterally     CV:  RRR  no s3 or murmur or increase in P2, and no edema   ABD:  soft and nontender with pos end  insp Hoover's  in the supine position.  No bruits or organomegaly appreciated   MS:  Nl gait/ ext warm without deformities Or obvious joint restrictions  calf tenderness, cyanosis or clubbing     SKIN: warm and dry without lesions    NEURO:  alert, approp, nl sensorium with  no motor or cerebellar deficits apparent.     I personally reviewed images and agree with radiology impression as follows:  CXR:   portable  10/15/21 Hyperinflation of the lungs. Possible emphysematous disease in the upper lobes with bibasilar scarring. No definite acute consolidation is noted.    Assessment   COPD GOLD ?  active smoking  Active smoker/ no limiting doe - 10/23/2021  After extensive coaching inhaler device,  effectiveness =    75% (short ti) > continue breztri up to 2 bid     Group D (now reclassified as E) in terms of symptom/risk and laba/lama/ICS  therefore appropriate rx at this point >>>  breztri 2bid if can afford it, if not can use prn symptoms plus approp saba  Re SABA :  I spent extra time with pt today reviewing  appropriate use of albuterol for prn use on exertion with the following points: 1) saba is for relief of sob that does not improve by walking a slower pace or resting but rather if the pt does not improve after trying this first. 2) If the pt is convinced, as many are, that saba helps recover from activity faster then it's easy to tell if this is the case by re-challenging : ie stop, take the  inhaler, then p 5 minutes try the exact same activity (intensity of workload) that just caused the symptoms and see if they are substantially diminished or not after saba 3) if there is an activity that reproducibly causes the symptoms, try the saba 15 min before the activity on alternate days   If in fact the saba really does help, then fine to continue to use it prn but advised may need to look closer at the maintenance regimen being used to achieve better control of airways disease with exertion.    F/u in 2 m with pfts, call sooner prn          Each maintenance medication was reviewed in detail including emphasizing most importantly the difference between maintenance and prns and under what circumstances the prns are to be triggered using an action plan format where appropriate.  Total time for H and P, chart review, counseling, reviewing hfa device(s) and generating customized AVS unique to this office visit / same day charting = 30 min with pt new to me         Cigarette smoker 4-5 min discussion re active cigarette smoking in addition to office E&M  Ask about tobacco use:   I took an extended  opportunity with this patient to outline the consequences of continued cigarette use  in airway disorders based on all the data we have from the multiple national lung health studies (perfomed over decades at millions of dollars in cost)  indicating that smoking cessation, not choice of inhalers or physicians, is the most important aspect of her care.   Advise quitting  Agree with rx with chantix per  PCP Assess willingness:  Not committed at this point  Also discussed lung cancer screening: Low-dose CT lung cancer screening is recommended for patients who are 53-53 years of age with a 20+ pack-year history of smoking and who are currently smoking or quit <=15 years ago. No coughing up blood  No unintentional weight loss of > 15 pounds in the last 6 months - pt is eligible for scanning yearly until age 91 per present guidelines  Discussed in detail all the  indications, usual  risks and alternatives  relative to the benefits with patient who agrees to proceed with w/u as outlined but would wait until fully recovered from covid  (at least 3 months to reduce false pos inflammatory nodules)   Ok to schedule thru PCP or we can do thru our program if preferred.                     Sandrea Hughs, MD 10/23/2021

## 2021-10-23 NOTE — Assessment & Plan Note (Addendum)
Active smoker/ no limiting doe - 10/23/2021  After extensive coaching inhaler device,  effectiveness =    75% (short ti) > continue breztri up to 2 bid     Group D (now reclassified as E) in terms of symptom/risk and laba/lama/ICS  therefore appropriate rx at this point >>>  breztri 2bid if can afford it, if not can use prn symptoms plus approp saba  Re SABA :  I spent extra time with pt today reviewing appropriate use of albuterol for prn use on exertion with the following points: 1) saba is for relief of sob that does not improve by walking a slower pace or resting but rather if the pt does not improve after trying this first. 2) If the pt is convinced, as many are, that saba helps recover from activity faster then it's easy to tell if this is the case by re-challenging : ie stop, take the inhaler, then p 5 minutes try the exact same activity (intensity of workload) that just caused the symptoms and see if they are substantially diminished or not after saba 3) if there is an activity that reproducibly causes the symptoms, try the saba 15 min before the activity on alternate days   If in fact the saba really does help, then fine to continue to use it prn but advised may need to look closer at the maintenance regimen being used to achieve better control of airways disease with exertion.    F/u in 2 m with pfts, call sooner prn          Each maintenance medication was reviewed in detail including emphasizing most importantly the difference between maintenance and prns and under what circumstances the prns are to be triggered using an action plan format where appropriate.  Total time for H and P, chart review, counseling, reviewing hfa device(s) and generating customized AVS unique to this office visit / same day charting = 30 min with pt new to me

## 2021-10-23 NOTE — Assessment & Plan Note (Signed)
4-5 min discussion re active cigarette smoking in addition to office E&M  Ask about tobacco use:   I took an extended  opportunity with this patient to outline the consequences of continued cigarette use  in airway disorders based on all the data we have from the multiple national lung health studies (perfomed over decades at millions of dollars in cost)  indicating that smoking cessation, not choice of inhalers or physicians, is the most important aspect of her care.   Advise quitting  Agree with rx with chantix per PCP Assess willingness:  Not committed at this point  Also discussed lung cancer screening: Low-dose CT lung cancer screening is recommended for patients who are 66-6 years of age with a 20+ pack-year history of smoking and who are currently smoking or quit <=15 years ago. No coughing up blood  No unintentional weight loss of > 15 pounds in the last 6 months - pt is eligible for scanning yearly until age 36 per present guidelines  Discussed in detail all the  indications, usual  risks and alternatives  relative to the benefits with patient who agrees to proceed with w/u as outlined but would wait until fully recovered from covid  (at least 3 months to reduce false pos inflammatory nodules)   Ok to schedule thru PCP or we can do thru our program if preferred.

## 2022-01-01 ENCOUNTER — Encounter: Payer: Self-pay | Admitting: Internal Medicine

## 2022-01-01 ENCOUNTER — Ambulatory Visit: Payer: Medicare HMO | Admitting: Internal Medicine

## 2022-01-01 ENCOUNTER — Ambulatory Visit (INDEPENDENT_AMBULATORY_CARE_PROVIDER_SITE_OTHER): Payer: Medicare HMO | Admitting: Internal Medicine

## 2022-01-01 VITALS — BP 118/66 | HR 79 | Temp 97.7°F | Ht 66.0 in | Wt 112.4 lb

## 2022-01-01 DIAGNOSIS — J449 Chronic obstructive pulmonary disease, unspecified: Secondary | ICD-10-CM

## 2022-01-01 DIAGNOSIS — Z87891 Personal history of nicotine dependence: Secondary | ICD-10-CM | POA: Insufficient documentation

## 2022-01-01 DIAGNOSIS — R0602 Shortness of breath: Secondary | ICD-10-CM

## 2022-01-01 LAB — PULMONARY FUNCTION TEST
DL/VA % pred: 56 %
DL/VA: 2.3 ml/min/mmHg/L
DLCO cor % pred: 41 %
DLCO cor: 8.74 ml/min/mmHg
DLCO unc % pred: 41 %
DLCO unc: 8.74 ml/min/mmHg
FEF 25-75 Post: 0.67 L/sec
FEF 25-75 Pre: 0.58 L/sec
FEF2575-%Change-Post: 14 %
FEF2575-%Pred-Post: 32 %
FEF2575-%Pred-Pre: 28 %
FEV1-%Change-Post: 1 %
FEV1-%Pred-Post: 55 %
FEV1-%Pred-Pre: 54 %
FEV1-Post: 1.37 L
FEV1-Pre: 1.36 L
FEV1FVC-%Change-Post: -1 %
FEV1FVC-%Pred-Pre: 76 %
FEV6-%Change-Post: 0 %
FEV6-%Pred-Post: 72 %
FEV6-%Pred-Pre: 72 %
FEV6-Post: 2.27 L
FEV6-Pre: 2.28 L
FEV6FVC-%Change-Post: -3 %
FEV6FVC-%Pred-Post: 98 %
FEV6FVC-%Pred-Pre: 101 %
FVC-%Change-Post: 2 %
FVC-%Pred-Post: 73 %
FVC-%Pred-Pre: 71 %
FVC-Post: 2.4 L
FVC-Pre: 2.33 L
Post FEV1/FVC ratio: 57 %
Post FEV6/FVC ratio: 95 %
Pre FEV1/FVC ratio: 58 %
Pre FEV6/FVC Ratio: 98 %
RV % pred: 283 %
RV: 6.43 L
TLC % pred: 168 %
TLC: 9.01 L

## 2022-01-01 NOTE — Assessment & Plan Note (Signed)
Quit smoking 11/01/21 c no limiting sob  - 10/23/2021    continue breztri up to 2 bid  - PFT's  01/01/2022   FEV1 1.37 (55 % ) ratio 0.57  p 1 % improvement from saba p breztri prior to study with DLCO  8.74 (41%)   and FV curve concave  - 01/01/2022  After extensive coaching inhaler device,  effectiveness =    90% > continue breztri 2bid    Group D (now reclassified as E) in terms of symptom/risk and laba/lama/ICS  therefore appropriate rx at this point >>>  breztri 2bid and approp saba     I reviewed the Fletcher curve with the patient that basically indicates  if you quit smoking when your best day FEV1 is still well preserved (as is clearly  the case here)  it is highly unlikely you will progress to severe disease and informed the patient there was  no medication on the market that has proven to alter the curve/ its downward trajectory  or the likelihood of progression of their disease(unlike other chronic medical conditions such as atheroclerosis where we do think we can change the natural hx with risk reducing meds)    Therefore stopping maintaining abstinence is  the most important aspects of her care, not choice of inhalers or for that matter, doctors.   Treatment other than smoking cessation  is entirely directed by severity of symptoms and focused also on reducing exacerbations, not attempting to change the natural history of the disease> adequate rx at present on breztri which can probably just be used q am at this point to save on cost with pm dose optional.  F/u here can be prn if pcp willing to refill breztri, or we can see yearly for this

## 2022-01-01 NOTE — Assessment & Plan Note (Addendum)
Quit smoking 11/01/21  Low-dose CT lung cancer screening is recommended for patients who are 27-69 years of age with a 20+ pack-year history of smoking and who are currently smoking or quit <=15 years ago. No coughing up blood  No unintentional weight loss of > 15 pounds in the last 6 months - pt is eligible for scanning yearly until age 77 > deferred to PCP unless o/w requested.  Discussed in detail all the  indications, usual  risks and alternatives  relative to the benefits with patient who agrees to proceed with w/u as outlined.             Each maintenance medication was reviewed in detail including emphasizing most importantly the difference between maintenance and prns and under what circumstances the prns are to be triggered using an action plan format where appropriate.  Total time for H and P, chart review, counseling, reviewing hfa device(s) and generating customized AVS unique to this office visit / same day charting  > 30 min final summary f/u ov

## 2022-01-01 NOTE — Progress Notes (Signed)
Kristin Trujillo, female    DOB: 1950-09-30   MRN: [id number]   Brief patient profile:  63  yowf from [facility] [place] quit smoking 11/01/21   referred to pulmonary clinic 10/23/2021 by [name] for copd with onset of symptoms with covid 19 10/03/21.  Seen in ER 10/03/21  with  [name]  pos covid testing >>>   rx paxlovid and rx breztri/saba  History of Present Illness  10/23/2021  Pulmonary/ 1st office eval/Thania Woodlief breztri hs only  Chief Complaint  Patient presents with   Consult    She had covid about 2 weeks ago and still feeling fatigue, and cough.    Dyspnea:  says walking thru stores fine/ walking dog ok = baseline now  Cough: none  Sleep: ok on 2 pillows SABA use: none  Rec Plan A =  Breztri Take 2 puffs first thing in am and then another 2 puffs about 12 hours later.  Work on inhaler technique:  Plan B = Backup (to supplement plan A, not to replace it) Only use your albuterol inhaler as a rescue medication  Plan C = Crisis (instead of Plan B but only if Plan B stops working) - only use your albuterol nebulizer if you first try Plan B  Good luck with stopping smoking  I recommend annual low dose lung cancer screening - either your PCP or thru this office Please schedule a follow up visit in 2- 3 months but call sooner if needed with pfts     01/01/2022  f/u ov/Kristin Trujillo re: GOLD 2  Copd   maint on breztri  and off cigs x 11/2021  Chief Complaint  Patient presents with   Follow-up    Pt doing well.  Stopped smoking 11/01/2021.  No sx noted  Dyspnea:  not limited at present and very active but no aerobics Cough: none  Sleeping: 1 pillows/ water bed SABA use: none  02: none Covid status:   vax all / infected x 1  Lung cancer screening :  rec     No obvious day to day or daytime variability or assoc excess/ purulent sputum or mucus plugs or hemoptysis or cp or chest tightness, subjective wheeze or overt sinus or hb symptoms.   Sleeping  without nocturnal  or early am  exacerbation  of respiratory  c/o's or need for noct saba. Also denies any obvious fluctuation of symptoms with weather or environmental changes or other aggravating or alleviating factors except as outlined above   No unusual exposure hx or h/o childhood pna/ asthma or knowledge of premature birth.  Current Allergies, Complete Past Medical History, Past Surgical History, Family History, and Social History were reviewed in [employer] record.  ROS  The following are not active complaints unless bolded Hoarseness, sore throat, dysphagia, dental problems, itching, sneezing,  nasal congestion or discharge of excess mucus or purulent secretions, ear ache,   fever, chills, sweats, unintended wt loss or wt gain, classically pleuritic or exertional cp,  orthopnea pnd or arm/hand swelling  or leg swelling, presyncope, palpitations, abdominal pain, anorexia, nausea, vomiting, diarrhea  or change in bowel habits or change in bladder habits, change in stools or change in urine, dysuria, hematuria,  rash, arthralgias, visual complaints, headache, numbness, weakness or ataxia or problems with walking or coordination,  change in mood or  memory.        Current Meds  Medication Sig   albuterol (PROVENTIL) (2.5 MG/3ML) 0.083% nebulizer solution Take  3 mLs (2.5 mg total) by nebulization every 6 (six) hours as needed for wheezing or shortness of breath.   albuterol (VENTOLIN HFA) 108 (90 Base) MCG/ACT inhaler Inhale 1-2 puffs into the lungs every 6 (six) hours as needed for wheezing or shortness of breath.   alendronate (FOSAMAX) 70 MG tablet Take 1 tablet (70 mg total) by mouth every 7 (seven) days. Take with a full glass of water on an empty stomach.   aspirin EC 81 MG EC tablet Take 1 tablet (81 mg total) by mouth daily.   atorvastatin (LIPITOR) 40 MG tablet Take 1 tablet (40 mg total) by mouth daily.   azelastine (ASTELIN) 0.1 % nasal spray Place 1 spray into both nostrils 2 (two) times  daily.   Budeson-Glycopyrrol-Formoterol (BREZTRI AEROSPHERE) 160-9-4.8 MCG/ACT AERO Take 2 puffs first thing in am and then another 2 puffs about 12 hours later.   nitroGLYCERIN (NITROSTAT) 0.4 MG SL tablet DISSOLVE ONE TABLET UNDER THE TONGUE EVERY 5 MINUTES AS NEEDED FOR CHEST PAIN.  DO NOT EXCEED A TOTAL OF 3 DOSES IN 15 MINUTES. Put on file                     Past Medical History:  Diagnosis Date   Anxiety    CAD (coronary artery disease)    a. 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   Depression    HLD (hyperlipidemia)    HTN (hypertension)    Tobacco abuse         Objective:       Wt Readings from Last 3 Encounters:  01/01/22 112 lb 6.4 oz (51 kg)  10/23/21 97 lb 9.6 oz (44.3 kg)  10/20/21 99 lb 12.8 oz (45.3 kg)      Vital signs reviewed  01/01/2022  - Note at rest 02 sats  96% on RA   General appearance:    amb thin wf nad    HEENT : Oropharynx  clear   Nasal turbinates nl    NECK :  without  apparent JVD/ palpable Nodes/TM    LUNGS: no acc muscle use,  Mild barrel  contour chest wall with bilateral  Distant bs s audible wheeze and  without cough on insp or exp maneuvers  and mild  Hyperresonant  to  percussion bilaterally     CV:  RRR  no s3 or murmur or increase in P2, and no edema   ABD:  soft and nontender   MS:  Nl gait/ ext warm without deformities Or obvious joint restrictions  calf tenderness, cyanosis or clubbing     SKIN: warm and dry without lesions    NEURO:  alert, approp, nl sensorium with  no motor or cerebellar deficits apparent.      Assessment

## 2022-01-01 NOTE — Progress Notes (Signed)
Full PFT Performed Today  

## 2022-01-01 NOTE — Patient Instructions (Signed)
Congratulations on not smoking - this is the most important aspect of your care!  Discuss lung cancer screening with PCP and we can arrange for it here if preferred   Ok to reduce breztri to 2 pffs in am and the pm doses optional   If you are satisfied with your treatment plan,  let your doctor know and he/she can either refill your medications or you can return here when your prescription runs out.     If in any way you are not 100% satisfied,  please tell us.  If 100% better, tell your friends!  Pulmonary follow up is as needed

## 2022-01-01 NOTE — Patient Instructions (Signed)
Full PFT Performed Today  

## 2022-01-11 ENCOUNTER — Ambulatory Visit (INDEPENDENT_AMBULATORY_CARE_PROVIDER_SITE_OTHER): Payer: Medicare HMO

## 2022-01-11 DIAGNOSIS — Z0000 Encounter for general adult medical examination without abnormal findings: Secondary | ICD-10-CM | POA: Diagnosis not present

## 2022-01-11 NOTE — Patient Instructions (Signed)
  Ms. Gassen , Thank you for taking time to come for your Medicare Wellness Visit. I appreciate your ongoing commitment to your health goals. Please review the following plan we discussed and let me know if I can assist you in the future.   These are the goals we discussed:  Goals      Patient Stated     01/08/2021 AWV Goal: Fall Prevention  Over the next year, patient will decrease their risk for falls by: Using assistive devices, such as a cane or walker, as needed Identifying fall risks within their home and correcting them by: Removing throw rugs Adding handrails to stairs or ramps Removing clutter and keeping a clear pathway throughout the home Increasing light, especially at night Adding shower handles/bars Raising toilet seat Identifying potential personal risk factors for falls: Medication side effects Incontinence/urgency Vestibular dysfunction Hearing loss Musculoskeletal disorders Neurological disorders Orthostatic hypotension       Patient Stated     01/11/2022 AWV Goal: Fall Prevention  Over the next year, patient will decrease their risk for falls by: Using assistive devices, such as a cane or walker, as needed Identifying fall risks within their home and correcting them by: Removing throw rugs Adding handrails to stairs or ramps Removing clutter and keeping a clear pathway throughout the home Increasing light, especially at night Adding shower handles/bars Raising toilet seat Identifying potential personal risk factors for falls: Medication side effects Incontinence/urgency Vestibular dysfunction Hearing loss Musculoskeletal disorders Neurological disorders Orthostatic hypotension          This is a list of the screening recommended for you and due dates:  Health Maintenance  Topic Date Due   DTaP/Tdap/Td vaccine (1 - Tdap) Never done   Screening for Lung Cancer  Never done   Zoster (Shingles) Vaccine (1 of 2) Never done   Mammogram   06/24/2021   COVID-19 Vaccine (4 - 2023-24 season) 10/02/2021   Medicare Annual Wellness Visit  01/08/2022   Pneumonia Vaccine (1 - PCV) 02/24/2022*   Flu Shot  05/02/2022*   Colon Cancer Screening  11/18/2022   DEXA scan (bone density measurement)  Completed   Hepatitis C Screening: USPSTF Recommendation to screen - Ages 23-79 yo.  Completed   HPV Vaccine  Aged Out  *Topic was postponed. The date shown is not the original due date.

## 2022-01-11 NOTE — Progress Notes (Signed)
Subjective:   Kristin Trujillo is a 71 y.o. female who presents for Medicare Annual (Subsequent) preventive examination.  I connected with  Kristin Trujillo on 01/11/22 by a audio enabled telemedicine application and verified that I am speaking with the correct person using two identifiers.  Patient Location: Home  Provider Location: Office/Clinic  I discussed the limitations of evaluation and management by telemedicine. The patient expressed understanding and agreed to proceed.   Review of Systems    Defer to PCP  Cardiac Risk Factors include: advanced age (>75men, >32 women);dyslipidemia;hypertension     Objective:    There were no vitals filed for this visit. There is no height or weight on file to calculate BMI.     01/11/2022   11:01 AM 10/15/2021    9:06 AM 10/10/2021    9:03 AM 01/08/2021    1:48 PM 05/13/2013    8:00 PM  Advanced Directives  Does Patient Have a Medical Advance Directive? No No Yes No Patient does not have advance directive;Patient would not like information  Does patient want to make changes to medical advance directive?  No - Patient declined No - Patient declined    Would patient like information on creating a medical advance directive? No - Patient declined No - Patient declined  No - Patient declined   Pre-existing out of facility DNR order (yellow form or pink MOST form)     No    Current Medications (verified) Outpatient Encounter Medications as of 01/11/2022  Medication Sig   alendronate (FOSAMAX) 70 MG tablet Take 1 tablet (70 mg total) by mouth every 7 (seven) days. Take with a full glass of water on an empty stomach.   aspirin EC 81 MG EC tablet Take 1 tablet (81 mg total) by mouth daily.   atorvastatin (LIPITOR) 40 MG tablet Take 1 tablet (40 mg total) by mouth daily.   azelastine (ASTELIN) 0.1 % nasal spray Place 1 spray into both nostrils 2 (two) times daily.   Budeson-Glycopyrrol-Formoterol (BREZTRI AEROSPHERE) 160-9-4.8 MCG/ACT AERO Take  2 puffs first thing in am and then another 2 puffs about 12 hours later.   nitroGLYCERIN (NITROSTAT) 0.4 MG SL tablet DISSOLVE ONE TABLET UNDER THE TONGUE EVERY 5 MINUTES AS NEEDED FOR CHEST PAIN.  DO NOT EXCEED A TOTAL OF 3 DOSES IN 15 MINUTES. Put on file   albuterol (PROVENTIL) (2.5 MG/3ML) 0.083% nebulizer solution Take 3 mLs (2.5 mg total) by nebulization every 6 (six) hours as needed for wheezing or shortness of breath. (Patient not taking: Reported on 01/11/2022)   albuterol (VENTOLIN HFA) 108 (90 Base) MCG/ACT inhaler Inhale 1-2 puffs into the lungs every 6 (six) hours as needed for wheezing or shortness of breath. (Patient not taking: Reported on 01/11/2022)   busPIRone (BUSPAR) 5 MG tablet Take 1 tablet (5 mg total) by mouth 3 (three) times daily. (Patient not taking: Reported on 01/01/2022)   ondansetron (ZOFRAN-ODT) 4 MG disintegrating tablet Take 1 tablet (4 mg total) by mouth every 8 (eight) hours as needed for nausea or vomiting. (Patient not taking: Reported on 01/01/2022)   Varenicline Tartrate, Starter, (CHANTIX STARTING MONTH PAK) 0.5 MG X 11 & 1 MG X 42 TBPK Take 0.5 mg tablet by mouth once daily x3 days, then 0.5 mg tablet twice daily x4 days, then increase to one 1 mg tablet twice daily. (Patient not taking: Reported on 01/01/2022)   No facility-administered encounter medications on file as of 01/11/2022.    Allergies (verified) Patient  has no known allergies.   History: Past Medical History:  Diagnosis Date   Anxiety    CAD (coronary artery disease)    a. 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   Depression    HLD (hyperlipidemia)    HTN (hypertension)    Tobacco abuse    Past Surgical History:  Procedure Laterality Date   LEFT HEART CATHETERIZATION WITH CORONARY ANGIOGRAM N/A 05/13/2013   Procedure: LEFT HEART CATHETERIZATION WITH CORONARY ANGIOGRAM;  Surgeon: Lesleigh Noe, MD;  Location: [facility] CATH LAB;  Service: Cardiovascular;  Laterality: N/A;   PERCUTANEOUS CORONARY  STENT INTERVENTION (PCI-S)  05/13/2013   Procedure: PERCUTANEOUS CORONARY STENT INTERVENTION (PCI-S);  Surgeon: Lesleigh Noe, MD;  Location: [facility] CATH LAB;  Service: Cardiovascular;;   Family History  Problem Relation Age of Onset   Hyperlipidemia Mother    Cancer Father    Thyroid disease Sister    Hyperlipidemia Brother    Cancer Son        neuroendocrine cancer   Social History   Socioeconomic History   Marital status: Widowed    Spouse name: Not on file   Number of children: 3   Years of education: Not on file   Highest education level: Not on file  Occupational History   Not on file  Tobacco Use   Smoking status: Former    Packs/day: 0.50    Years: 50.00    Total pack years: 25.00    Types: Cigarettes    Quit date: 11/01/2021    Years since quitting: 0.1    Passive exposure: Past   Smokeless tobacco: Never   Tobacco comments:    1/2 ppd 10/23/21. Patient stopped smoking 11/01/2021  Vaping Use   Vaping Use: Never used  Substance and Sexual Activity   Alcohol use: No   Drug use: No   Sexual activity: Not Currently  Other Topics Concern   Not on file  Social History Narrative   Patient is widowed.  Her husband passed away May 30, 2011 from colon cancer   Her middle son also passed away from some type of neuroendocrine cancer   She has 2 living sons.  Her eldest typically brings her to appointments.   She is an active every day smoker   She has dogs   She resides alone   Eats very healthy and prepares all meals   Social Determinants of Health   Financial Resource Strain: Low Risk  (01/11/2022)   Overall Financial Resource Strain (CARDIA)    Difficulty of Paying Living Expenses: Not hard at all  Food Insecurity: No Food Insecurity (01/11/2022)   Hunger Vital Sign    Worried About Running Out of Food in the Last Year: Never true    Ran Out of Food in the Last Year: Never true  Transportation Needs: No Transportation Needs (01/11/2022)   PRAPARE - Therapist, art (Medical): No    Lack of Transportation (Non-Medical): No  Physical Activity: Sufficiently Active (01/11/2022)   Exercise Vital Sign    Days of Exercise per Week: 7 days    Minutes of Exercise per Session: 60 min  Stress: No Stress Concern Present (01/11/2022)   [employer] of Occupational Health - Occupational Stress Questionnaire    Feeling of Stress : Not at all  Social Connections: Socially Isolated (01/11/2022)   Social Connection and Isolation Panel [NHANES]    Frequency of Communication with Friends and Family: More than three times a  week    Frequency of Social Gatherings with Friends and Family: More than three times a week    Attends Religious Services: Never    Database administrator or Organizations: No    Attends Banker Meetings: Never    Marital Status: Widowed    Tobacco Counseling Counseling given: Not Answered Tobacco comments: 1/2 ppd 10/23/21. Patient stopped smoking 11/01/2021   Clinical Intake:  Pre-visit preparation completed: Yes  Pain : No/denies pain   Nutritional Risks: None Diabetes: No  How often do you need to have someone help you when you read instructions, pamphlets, or other written materials from your doctor or pharmacy?: 1 - Never  Diabetic? No  Interpreter Needed?: No  Information entered by :: Mariam Dollar LPN   Activities of Daily Living    01/11/2022   11:04 AM  In your present state of health, do you have any difficulty performing the following activities:  Hearing? 0  Vision? 0  Difficulty concentrating or making decisions? 0  Walking or climbing stairs? 0  Dressing or bathing? 0  Doing errands, shopping? 0  Preparing Food and eating ? N  Using the Toilet? N  In the past six months, have you accidently leaked urine? N  Do you have problems with loss of bowel control? N  Managing your Medications? N  Managing your Finances? N  Housekeeping or managing your Housekeeping? N     Patient Care Team: Raliegh Ip, DO as PCP - General (Family Medicine) Lyn Records, MD as PCP - Cardiology (Cardiology)  Indicate any recent Medical Services you may have received from other than [facility] providers in the past year (date may be approximate).     Assessment:   This is a routine wellness examination for [place].  Hearing/Vision screen No results found.  Dietary issues and exercise activities discussed: Current Exercise Habits: Home exercise routine, Type of exercise: walking, Time (Minutes): > 60, Frequency (Times/Week): 7, Weekly Exercise (Minutes/Week): 0, Intensity: Moderate   Goals Addressed             This Visit's Progress    Patient Stated       01/11/2022 AWV Goal: Fall Prevention  Over the next year, patient will decrease their risk for falls by: Using assistive devices, such as a cane or walker, as needed Identifying fall risks within their home and correcting them by: Removing throw rugs Adding handrails to stairs or ramps Removing clutter and keeping a clear pathway throughout the home Increasing light, especially at night Adding shower handles/bars Raising toilet seat Identifying potential personal risk factors for falls: Medication side effects Incontinence/urgency Vestibular dysfunction Hearing loss Musculoskeletal disorders Neurological disorders Orthostatic hypotension         Depression Screen    01/11/2022   10:56 AM 10/20/2021    8:49 AM 08/14/2021    3:18 PM 07/10/2021   10:17 AM 03/02/2021   11:20 AM 02/24/2021   10:04 AM 01/08/2021    1:55 PM  PHQ 2/9 Scores  PHQ - 2 Score 0  0 0 0 0 0  Exception Documentation  Patient refusal       Not completed  pt refusual         Fall Risk    01/11/2022   11:07 AM 10/20/2021    8:49 AM 08/14/2021    3:18 PM 02/24/2021   10:04 AM 01/08/2021    1:54 PM  Fall Risk   Falls in the past year? 0 0  0 0 0  Follow up Falls evaluation completed    Falls evaluation completed     FALL RISK PREVENTION PERTAINING TO THE HOME:  Any stairs in or around the home? Yes  If so, are there any without handrails? Yes  Home free of loose throw rugs in walkways, pet beds, electrical cords, etc? Yes  Adequate lighting in your home to reduce risk of falls? Yes   ASSISTIVE DEVICES UTILIZED TO PREVENT FALLS:  Life alert? No  Use of a cane, walker or w/c? No  Grab bars in the bathroom? No  Shower chair or bench in shower? Yes  Elevated toilet seat or a handicapped toilet? No   TIMED UP AND GO:  Was the test performed? No .    Cognitive Function:        01/11/2022   11:05 AM 01/08/2021    1:53 PM  6CIT Screen  What Year? 0 points 0 points  What month? 0 points 0 points  What time? 0 points 0 points  Count back from 20 0 points 0 points  Months in reverse 0 points 0 points  Repeat phrase 0 points 0 points  Total Score 0 points 0 points    Immunizations Immunization History  Administered Date(s) Administered   PFIZER Comirnaty(Gray Top)Covid-19 Tri-Sucrose Vaccine 04/20/2019, 05/11/2019, 02/10/2020    TDAP status: Due, Education has been provided regarding the importance of this vaccine. Advised may receive this vaccine at local pharmacy or Health Dept. Aware to provide a copy of the vaccination record if obtained from local pharmacy or Health Dept. Verbalized acceptance and understanding.  Flu Vaccine status: Due, Education has been provided regarding the importance of this vaccine. Advised may receive this vaccine at local pharmacy or Health Dept. Aware to provide a copy of the vaccination record if obtained from local pharmacy or Health Dept. Verbalized acceptance and understanding.  Pneumococcal vaccine status: Due, Education has been provided regarding the importance of this vaccine. Advised may receive this vaccine at local pharmacy or Health Dept. Aware to provide a copy of the vaccination record if obtained from local pharmacy or Health Dept. Verbalized  acceptance and understanding.  Covid-19 vaccine status: Completed vaccines  Qualifies for Shingles Vaccine? Yes   Zostavax completed No   Shingrix Completed?: No.    Education has been provided regarding the importance of this vaccine. Patient has been advised to call insurance company to determine out of pocket expense if they have not yet received this vaccine. Advised may also receive vaccine at local pharmacy or Health Dept. Verbalized acceptance and understanding.  Screening Tests Health Maintenance  Topic Date Due   DTaP/Tdap/Td (1 - Tdap) Never done   Lung Cancer Screening  Never done   Zoster Vaccines- Shingrix (1 of 2) Never done   MAMMOGRAM  06/24/2021   COVID-19 Vaccine (4 - 2023-24 season) 10/02/2021   Medicare Annual Wellness (AWV)  01/08/2022   Pneumonia Vaccine 16+ Years old (1 - PCV) 02/24/2022 (Originally 07/06/2017)   INFLUENZA VACCINE  05/02/2022 (Originally 09/01/2021)   COLONOSCOPY (Pts 45-34yrs Insurance coverage will need to be confirmed)  11/18/2022   DEXA SCAN  Completed   Hepatitis C Screening  Completed   HPV VACCINES  Aged Out    Health Maintenance  Health Maintenance Due  Topic Date Due   DTaP/Tdap/Td (1 - Tdap) Never done   Lung Cancer Screening  Never done   Zoster Vaccines- Shingrix (1 of 2) Never done   MAMMOGRAM  06/24/2021  COVID-19 Vaccine (4 - 2023-24 season) 10/02/2021   Medicare Annual Wellness (AWV)  01/08/2022    Colorectal cancer screening: Type of screening: Colonoscopy. Completed 11/17/2012. Repeat every 10 years  Mammogram status: Completed 06/15/2019. Repeat every year  Bone Density status: Completed 04/15/2020. Results reflect: Bone density results: OSTEOPOROSIS. Repeat every two years.  Lung Cancer Screening: (Low Dose CT Chest recommended if Age 49-80 years, 30 pack-year currently smoking OR have quit w/in 15years.) does qualify.    Additional Screening:  Hepatitis C Screening: does not qualify; Completed 03/26/2019  Vision  Screening: Recommended annual ophthalmology exams for early detection of glaucoma and other disorders of the eye. Is the patient up to date with their annual eye exam?  No  Who is the provider or what is the name of the office in which the patient attends annual eye exams? [facility] If pt is not established with a provider, would they like to be referred to a provider to establish care?  N/A .   Dental Screening: Recommended annual dental exams for proper oral hygiene  Community Resource Referral / Chronic Care Management: CRR required this visit?  No   CCM required this visit?  No      Plan:     I have personally reviewed and noted the following in the patient's chart:   Medical and social history Use of alcohol, tobacco or illicit drugs  Current medications and supplements including opioid prescriptions. Patient is not currently taking opioid prescriptions. Functional ability and status Nutritional status Physical activity Advanced directives List of other physicians Hospitalizations, surgeries, and ER visits in previous 12 months Vitals Screenings to include cognitive, depression, and falls Referrals and appointments  In addition, I have reviewed and discussed with patient certain preventive protocols, quality metrics, and best practice recommendations. A written personalized care plan for preventive services as well as general preventive health recommendations were provided to patient.     Mariam DollarJan Ludie Pavlik LPN     16/10/960412/12/2021   Nurse Notes: Patient is due for Tdap, Flu, Pneumonia, and Shingrix vaccine.  She is also past due for her mammogram and eye exam.  She qualifies for the lung cancer screening.

## 2022-01-24 IMAGING — MG DIGITAL SCREENING BILAT W/ TOMO W/ CAD
8 series · 9 of 24 positions shown · non-contrast
Comparison: Previous exam(s).

CLINICAL DATA: Screening.

EXAM:
DIGITAL SCREENING BILATERAL MAMMOGRAM WITH TOMO AND CAD

[R CC synth-2D]
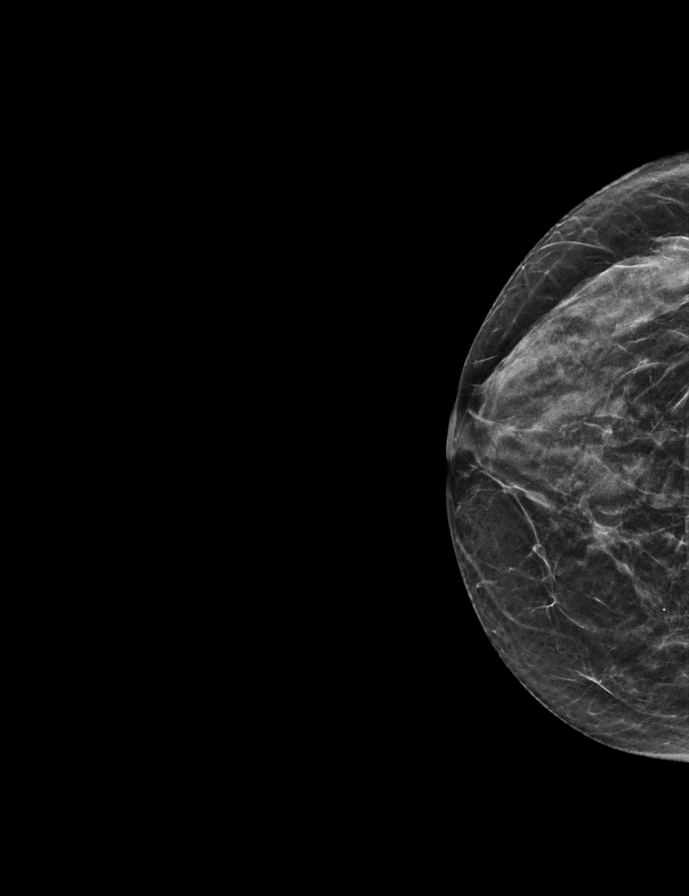

[L CC synth-2D]
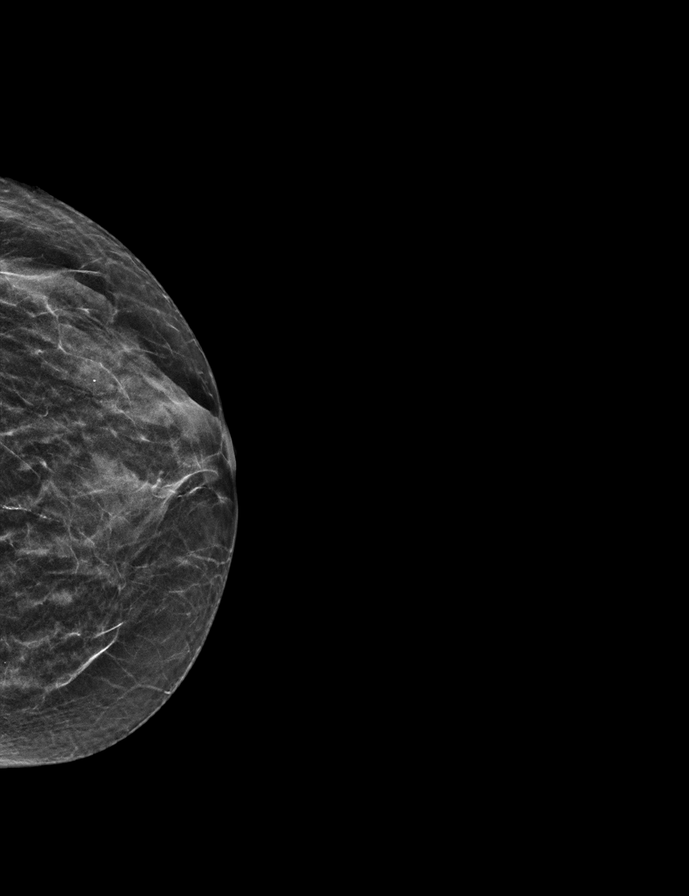

[L MLO synth-2D]
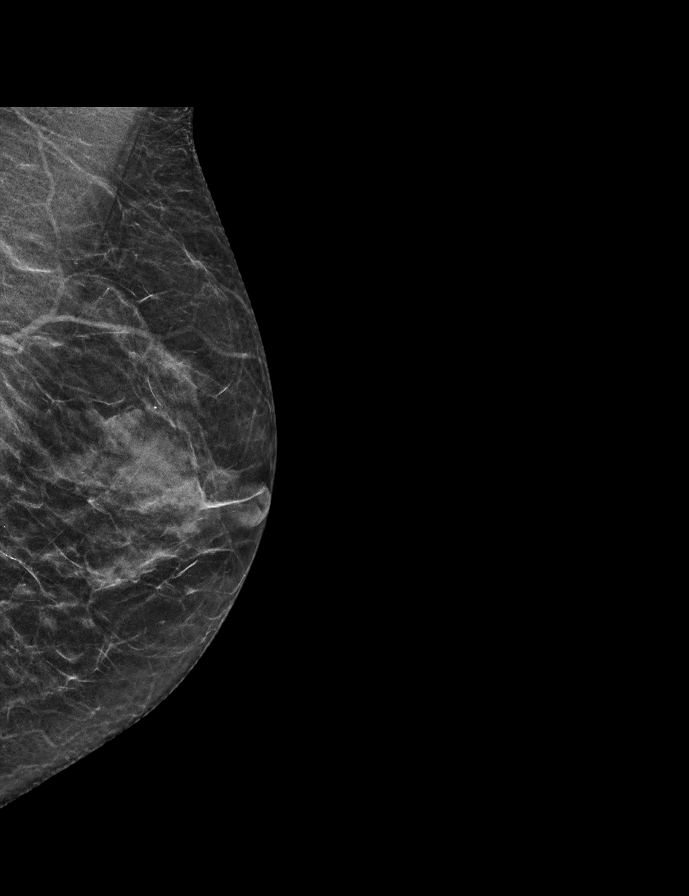

[R MLO synth-2D]
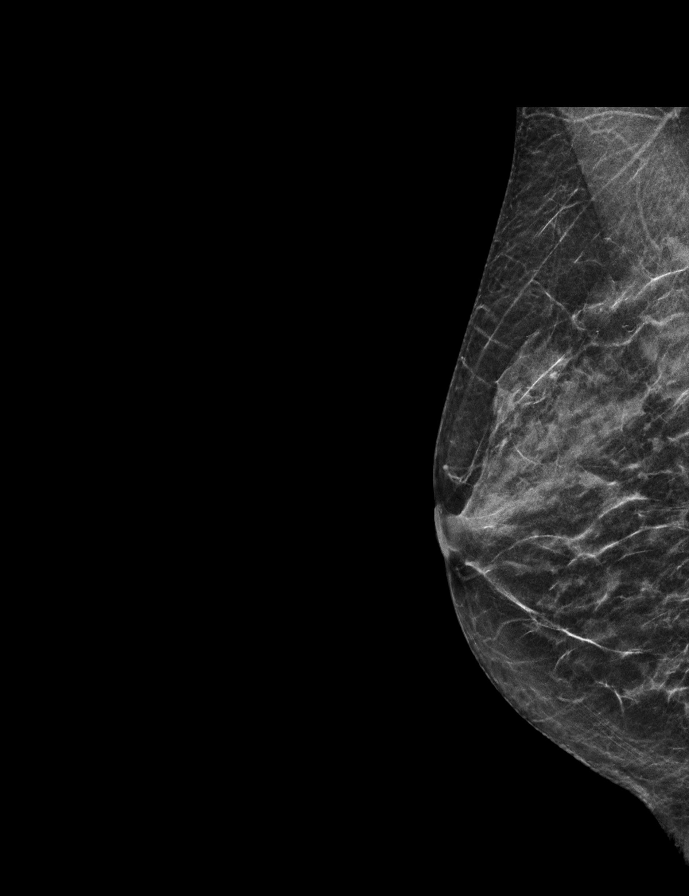

[R MLO tomo · 2 of 40 frames shown]
[frame 13/40]
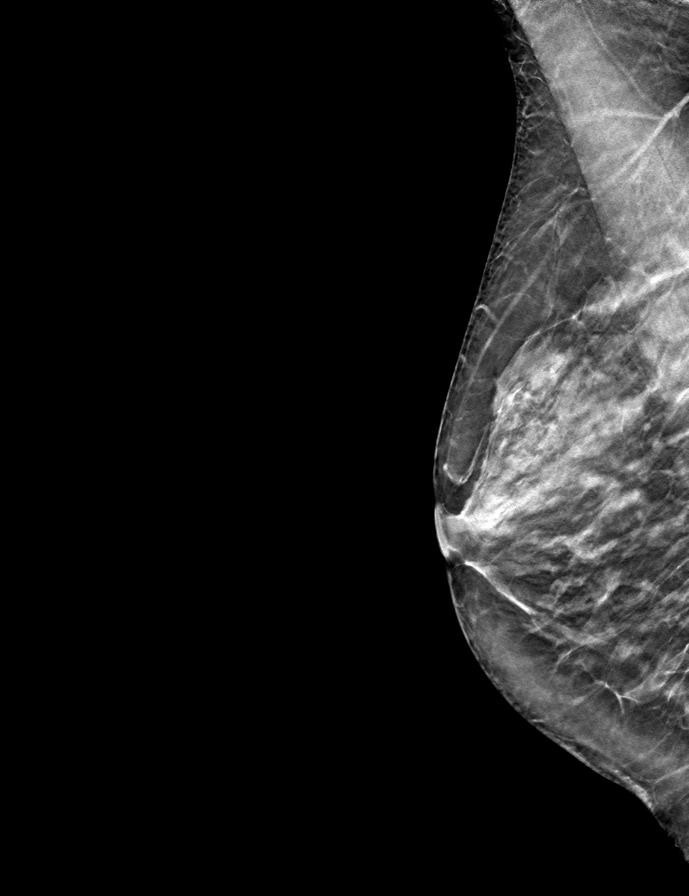
[frame 21/40]
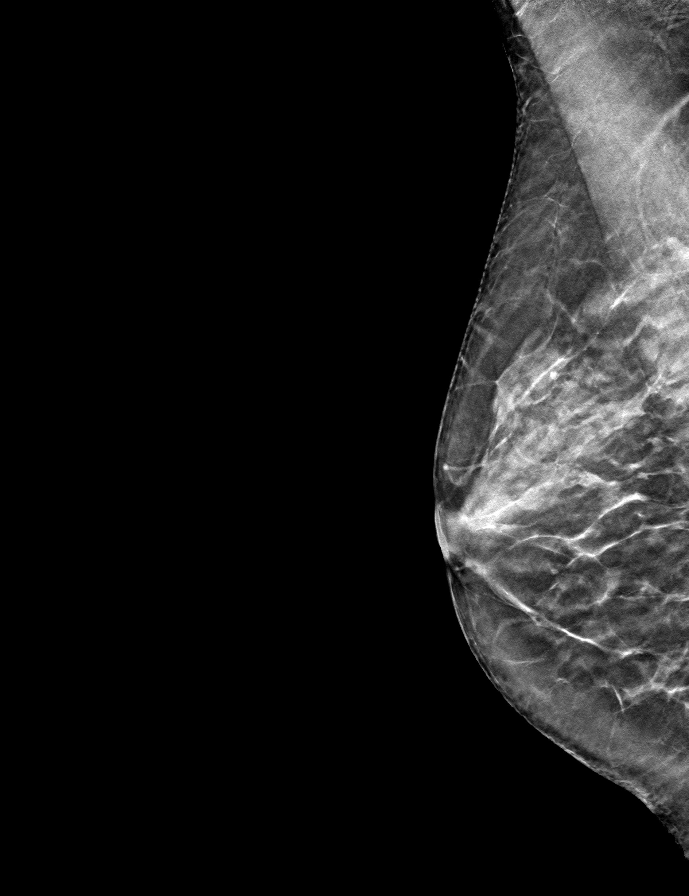

[L CC tomo · tomo slice 21/41.0]
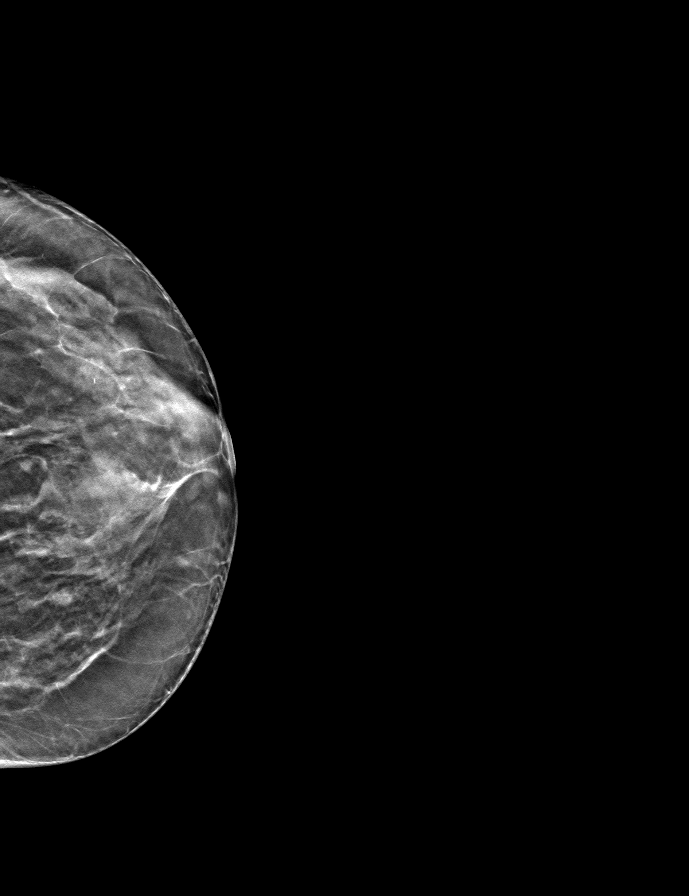

[L MLO tomo · tomo slice 21/41.0]
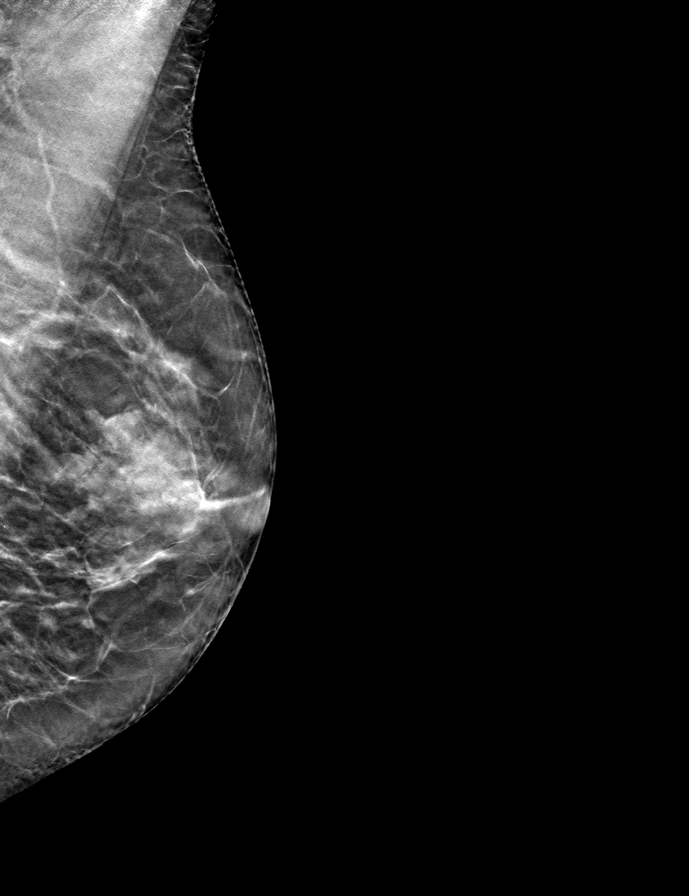

[R CC tomo · tomo slice 21/42.0]
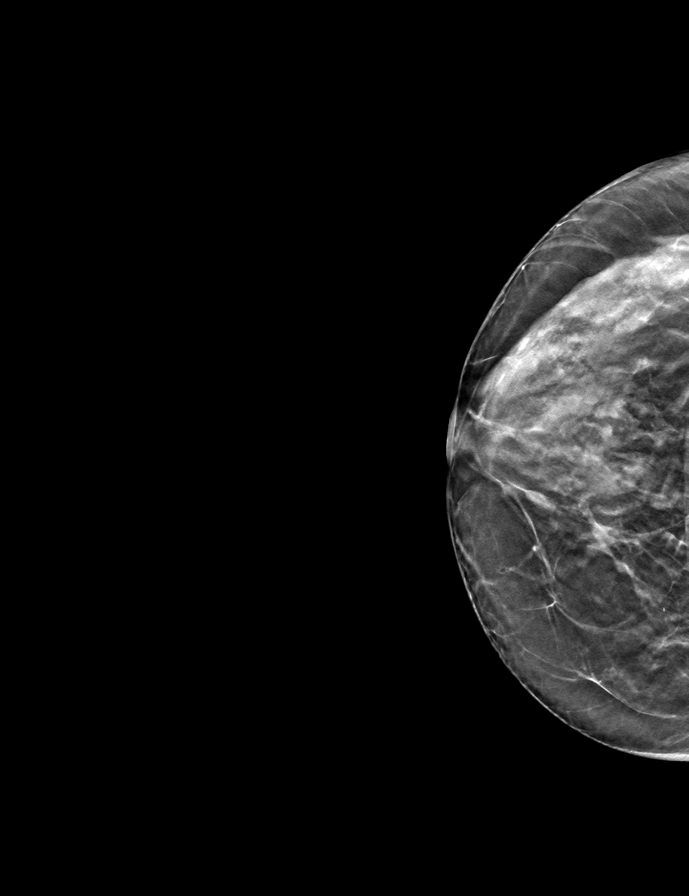

[9 of 24 positions shown; findings below may reference images not displayed]

ACR Breast Density Category c: The breast tissue is heterogeneously
dense, which may obscure small masses.
FINDINGS: There are no findings suspicious for malignancy. Images were
processed with CAD.
IMPRESSION: No mammographic evidence of malignancy. A result letter of this
screening mammogram will be mailed directly to the patient.

RECOMMENDATION:
Screening mammogram in one year. (Code:[id number])

BI-RADS CATEGORY  1: Negative.

## 2022-03-31 ENCOUNTER — Other Ambulatory Visit: Payer: Self-pay | Admitting: Family Medicine

## 2022-03-31 DIAGNOSIS — E785 Hyperlipidemia, unspecified: Secondary | ICD-10-CM

## 2022-03-31 DIAGNOSIS — I2510 Atherosclerotic heart disease of native coronary artery without angina pectoris: Secondary | ICD-10-CM

## 2022-04-01 NOTE — Telephone Encounter (Signed)
Patient has appointment scheduled for (08/31/2022 CPE) and will need medication refilled for (atorvastatin (LIPITOR) 40 MG tablet  until his appointment date.  Please advise.

## 2022-04-02 NOTE — Telephone Encounter (Signed)
See note for 04/01/2022 at 10:45am.

## 2022-04-09 NOTE — Telephone Encounter (Signed)
PT RC about refill. Please call back

## 2022-04-12 NOTE — Telephone Encounter (Signed)
Patient calling back because she has not heard back about medication. Please call back

## 2022-04-13 ENCOUNTER — Telehealth: Payer: Self-pay | Admitting: Family Medicine

## 2022-04-13 MED ORDER — ATORVASTATIN CALCIUM 40 MG PO TABS: 40.0000 mg | ORAL_TABLET | Freq: Every day | ORAL | 1 refills | Status: DC

## 2022-04-13 NOTE — Telephone Encounter (Signed)
Attempted to call pt, left vm  

## 2022-04-13 NOTE — Telephone Encounter (Signed)
Patient comes to the office because she hadn't heard anything about her atorvastatin being called in. She has left three messages and no response. Patient has appt 7-30 with Lajuana Ripple and needs refills to get her through.

## 2022-04-13 NOTE — Telephone Encounter (Signed)
That's unusual.  I'm not aware of this med increasing urination.  Almyra Free, can you weigh in?

## 2022-04-13 NOTE — Telephone Encounter (Signed)
That's what I thought.  Kelci, can you inform her that it's unlikely from the inhaler.  Do we need to get her on DOD to check for UTI?

## 2022-04-13 NOTE — Addendum Note (Signed)
Addended by: Antonietta Barcelona D on: 04/13/2022 05:00 PM   Modules accepted: Orders

## 2022-04-14 ENCOUNTER — Encounter: Payer: Self-pay | Admitting: Family Medicine

## 2022-04-14 ENCOUNTER — Telehealth (INDEPENDENT_AMBULATORY_CARE_PROVIDER_SITE_OTHER): Payer: Medicare HMO | Admitting: Family Medicine

## 2022-04-14 DIAGNOSIS — R350 Frequency of micturition: Secondary | ICD-10-CM | POA: Diagnosis not present

## 2022-04-14 LAB — MICROSCOPIC EXAMINATION
Bacteria, UA: NONE SEEN
Epithelial Cells (non renal): NONE SEEN /hpf (ref 0–10)
RBC, Urine: NONE SEEN /hpf (ref 0–2)
Renal Epithel, UA: NONE SEEN /hpf
WBC, UA: NONE SEEN /hpf (ref 0–5)

## 2022-04-14 LAB — URINALYSIS, ROUTINE W REFLEX MICROSCOPIC
Bilirubin, UA: NEGATIVE
Glucose, UA: NEGATIVE
Ketones, UA: NEGATIVE
Leukocytes,UA: NEGATIVE
Nitrite, UA: NEGATIVE
Protein,UA: NEGATIVE
Specific Gravity, UA: <1.005 — ABNORMAL LOW (ref 1.005–1.030)
Urobilinogen, Ur: 0.2 mg/dL (ref 0.2–1.0)
pH, UA: 6 (ref 5.0–7.5)

## 2022-04-14 NOTE — Progress Notes (Signed)
Virtual Visit via Video   I connected with patient on 04/14/22 at  1015 AM EDT by a video enabled telemedicine application and verified that I am speaking with the correct person using two identifiers.  Location patient: Home Location provider: [place] office Persons participating in the virtual visit: Patient, Provider.  I discussed the limitations of evaluation and management by telemedicine and the availability of in person appointments. The patient expressed understanding and agreed to proceed.  Subjective:   HPI:   Pt presents today with complaints of urinary frequency. She does have some lower back aching but has been working in the yard over the last few days. States the frequency started several days ago. On associated fever, chills, abdominal pain, weakness, or confusion. No hematuria or vaginal symptoms. Has not tried anything for her symptoms. States she is on [place] and is aware this can cause urinary tract infections and would like to be checked.   ROS:   See pertinent positives and negatives per HPI.  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 Patient stopped smoking 11/01/2021  Substance Use Topics   Alcohol use: No    Current Outpatient Medications:    albuterol (PROVENTIL) (2.5 MG/3ML) 0.083% nebulizer solution, Take 3  mLs (2.5 mg total) by nebulization every 6 (six) hours as needed for wheezing or shortness of breath. (Patient not taking: Reported on 01/11/2022), Disp: 75 mL, Rfl: 12   albuterol (VENTOLIN HFA) 108 (90 Base) MCG/ACT inhaler, Inhale 1-2 puffs into the lungs every 6 (six) hours as needed for wheezing or shortness of breath. (Patient not taking: Reported on 01/11/2022), Disp: 1 each, Rfl: 0   alendronate (FOSAMAX) 70 MG tablet, Take 1 tablet (70 mg total) by mouth every 7 (seven) days. Take with a full glass of water on an empty stomach., Disp: 12 tablet, Rfl: 3   aspirin EC 81 MG EC tablet, Take 1 tablet (81 mg total) by mouth daily., Disp: , Rfl:    atorvastatin (LIPITOR) 40 MG tablet, Take 1 tablet (40 mg total) by mouth daily., Disp: 90 tablet, Rfl: 1   azelastine (ASTELIN) 0.1 % nasal spray, Place 1 spray into both nostrils 2 (two) times daily., Disp: 30 mL, Rfl: 12   Budeson-Glycopyrrol-Formoterol (BREZTRI AEROSPHERE) 160-9-4.8 MCG/ACT AERO, Take 2 puffs first thing in am and then another 2 puffs about 12 hours later., Disp: 10.7 g, Rfl: 11   busPIRone (BUSPAR) 5 MG tablet, Take 1 tablet (5 mg total) by mouth 3 (three) times daily. (Patient not taking: Reported on 01/01/2022), Disp: 90 tablet, Rfl: 1   nitroGLYCERIN (NITROSTAT) 0.4 MG SL tablet, DISSOLVE ONE TABLET UNDER THE TONGUE EVERY 5 MINUTES AS NEEDED FOR CHEST PAIN.  DO NOT EXCEED  A TOTAL OF 3 DOSES IN 15 MINUTES. Put on file, Disp: 25 tablet, Rfl: 1   ondansetron (ZOFRAN-ODT) 4 MG disintegrating tablet, Take 1 tablet (4 mg total) by mouth every 8 (eight) hours as needed for nausea or vomiting. (Patient not taking: Reported on 01/01/2022), Disp: 20 tablet, Rfl: 0   Varenicline Tartrate, Starter, (CHANTIX STARTING MONTH PAK) 0.5 MG X 11 & 1 MG X 42 TBPK, Take 0.5 mg tablet by mouth once daily x3 days, then 0.5 mg tablet twice daily x4 days, then increase to one 1 mg tablet twice daily. (Patient not taking: Reported on 01/01/2022), Disp: 53 each,  Rfl: 0  No Known Allergies  Objective:   There were no vitals taken for this visit as this was a video visit.   Patient is well-developed, well-nourished in no acute distress.  Resting comfortably at home.  Head is normocephalic, atraumatic.  No labored breathing.  Speech is clear and coherent with logical content.  Patient is alert and oriented at baseline.  No acute distress  Assessment and Plan:   Brigitta was seen today for urinary frequency.  Diagnoses and all orders for this visit:  Frequent urination Urinalysis unremarkable. Culture added. Will treat if warranted. No indications of acute pyelonephritis. Pt aware to increase water intake and avoid bladder irritants such as caffeine as this can cause frequency and urgency. Report new, worsening, or persistent symptoms.  -     Urinalysis, Routine w reflex microscopic -     Urine Culture   Return if symptoms worsen or fail to improve.  Monia Pouch, FNP-C [facility] [address] [place], [place] [address] [phone]   Time spent with the patient: 12 minutes, of which >50% was spent in obtaining information about symptoms, reviewing previous labs, evaluations, and treatments, counseling about condition (please see the discussed topics above), and developing a plan to further investigate it; had a number of questions which I addressed.

## 2022-04-16 LAB — URINE CULTURE

## 2022-04-21 ENCOUNTER — Telehealth: Payer: Self-pay | Admitting: Internal Medicine

## 2022-04-21 NOTE — Telephone Encounter (Signed)
Should be placed on symbicort 80 generic Take 2 puffs first thing in am and then another 2 puffs about 12 hours later in place of breztri and get the benefit without the side effects but if feels breathing just as good on it as off then can hold off until next ov and regroup then

## 2022-04-21 NOTE — Telephone Encounter (Signed)
Pt calling in bc she think the breztri is making her urinate frequently.

## 2022-04-21 NOTE — Telephone Encounter (Signed)
Called and spoke w/ pt she verbalized that she feels like the Judithann Sauger is causing her to urinate more frequently and she has no bladder control. She reports that she stopped taking the [facility] for 1 week and the problems subsided.  PCP suggested to pt it could be an UTI but her urinalysis was clear.   Dr.Wert please advise?

## 2022-04-22 ENCOUNTER — Telehealth: Payer: Self-pay | Admitting: Family Medicine

## 2022-04-22 DIAGNOSIS — M810 Age-related osteoporosis without current pathological fracture: Secondary | ICD-10-CM

## 2022-04-22 MED ORDER — ALENDRONATE SODIUM 70 MG PO TABS: 70.0000 mg | ORAL_TABLET | ORAL | 1 refills | Status: DC

## 2022-04-22 NOTE — Telephone Encounter (Signed)
  Prescription Request  04/22/2022  Is this a "Controlled Substance" medicine?   Have you seen your PCP in the last 2 weeks? Appt 7/30  If YES, route message to pool  -  If NO, patient needs to be scheduled for appointment.  What is the name of the medication or equipment? alendronate (FOSAMAX) 70 MG tablet   Have you contacted your pharmacy to request a refill? yes   Which pharmacy would you like this sent to?  [facility], [place] [address]      Patient notified that their request is being sent to the clinical staff for review and that they should receive a response within 2 business days.

## 2022-04-23 NOTE — Telephone Encounter (Signed)
Called and spoke w/ pt she states that she would like to hold off on starting a new inhaler right now. She states that she weill call us if she changes her mind.   Sending to MW as an Pharmacist, hospital.

## 2022-04-23 NOTE — Telephone Encounter (Signed)
Pt calling saying she did have the opportunity to speak to nurse but had other questions. I read Dr. Morrison Old notation. Has additional questions. Pls call @ [phone]

## 2022-08-31 ENCOUNTER — Ambulatory Visit (INDEPENDENT_AMBULATORY_CARE_PROVIDER_SITE_OTHER): Payer: Medicare HMO | Admitting: Family Medicine

## 2022-08-31 ENCOUNTER — Encounter: Payer: Self-pay | Admitting: Family Medicine

## 2022-08-31 VITALS — BP 146/88 | HR 76 | Temp 98.6°F | Ht 66.0 in | Wt 113.8 lb

## 2022-08-31 DIAGNOSIS — Z0001 Encounter for general adult medical examination with abnormal findings: Secondary | ICD-10-CM | POA: Diagnosis not present

## 2022-08-31 DIAGNOSIS — M810 Age-related osteoporosis without current pathological fracture: Secondary | ICD-10-CM

## 2022-08-31 DIAGNOSIS — Z0000 Encounter for general adult medical examination without abnormal findings: Secondary | ICD-10-CM

## 2022-08-31 DIAGNOSIS — E785 Hyperlipidemia, unspecified: Secondary | ICD-10-CM

## 2022-08-31 DIAGNOSIS — J3081 Allergic rhinitis due to animal (cat) (dog) hair and dander: Secondary | ICD-10-CM

## 2022-08-31 DIAGNOSIS — Z5320 Procedure and treatment not carried out because of patient's decision for unspecified reasons: Secondary | ICD-10-CM

## 2022-08-31 DIAGNOSIS — I2510 Atherosclerotic heart disease of native coronary artery without angina pectoris: Secondary | ICD-10-CM | POA: Diagnosis not present

## 2022-08-31 DIAGNOSIS — Z23 Encounter for immunization: Secondary | ICD-10-CM

## 2022-08-31 MED ORDER — DESLORATADINE 5 MG PO TABS: 5.0000 mg | ORAL_TABLET | Freq: Every day | ORAL | 3 refills | Status: DC

## 2022-08-31 NOTE — Progress Notes (Signed)
PATRICK VENESS is a 72 y.o. female presents to office today for annual physical exam examination.    Concerns today include: 1.  None.  Overall she seems to be doing okay.  She continues not to want any lung cancer screening because of her witnessing her parents going through horrible time with treatment for lung cancer.  She does report that she successfully quit smoking in October 2023 and seems to be doing okay from the standpoint.  She stopped Breztri because it was causing bladder dysfunction and her pulmonologist of course recommended that this be stopped immediately.  She was offered to start Symbicort but never did because she just felt like she was doing okay and wanted to clean out of her system  Occupation: retired, Substance use: former smoker Health Maintenance Due  Topic Date Due   MAMMOGRAM  06/24/2021   Colonoscopy  11/18/2022   Immunization History  Administered Date(s) Administered   PFIZER Comirnaty(Gray Top)Covid-19 Tri-Sucrose Vaccine 04/20/2019, 05/11/2019, 02/10/2020   Tdap 08/31/2022   Past Medical History:  Diagnosis Date   Anxiety    CAD (coronary artery disease)    a. 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   Depression    HLD (hyperlipidemia)    HTN (hypertension)    Tobacco abuse    Social History   Socioeconomic History   Marital status: Widowed    Spouse name: Not on file   Number of children: 3   Years of education: Not on file   Highest education level: Not on file  Occupational History   Not on file  Tobacco Use   Smoking status: Former    Current packs/day: 0.00    Average packs/day: 0.5 packs/day for 50.0 years (25.0 ttl pk-yrs)    Types: Cigarettes    Start date: 11/02/1971    Quit date: 11/01/2021    Years since quitting: 0.8    Passive exposure: Past   Smokeless tobacco: Never   Tobacco comments:    1/2 ppd 10/23/21. Patient stopped smoking 11/01/2021  Vaping Use   Vaping status: Never Used  Substance and Sexual Activity   Alcohol  use: No   Drug use: No   Sexual activity: Not Currently  Other Topics Concern   Not on file  Social History Narrative   Patient is widowed.  Her husband passed away 09/09/2011 from colon cancer   Her middle son also passed away from some type of neuroendocrine cancer   She has 2 living sons.  Her eldest typically brings her to appointments.   She is an active every day smoker   She has dogs   She resides alone   Eats very healthy and prepares all meals   Social Determinants of Health   Financial Resource Strain: Low Risk  (01/11/2022)   Overall Financial Resource Strain (CARDIA)    Difficulty of Paying Living Expenses: Not hard at all  Food Insecurity: No Food Insecurity (01/11/2022)   Hunger Vital Sign    Worried About Running Out of Food in the Last Year: Never true    Ran Out of Food in the Last Year: Never true  Transportation Needs: No Transportation Needs (01/11/2022)   PRAPARE - Administrator, Civil Service (Medical): No    Lack of Transportation (Non-Medical): No  Physical Activity: Sufficiently Active (01/11/2022)   Exercise Vital Sign    Days of Exercise per Week: 7 days    Minutes of Exercise per Session: 60 min  Stress:  No Stress Concern Present (01/11/2022)   [employer] of Occupational Health - Occupational Stress Questionnaire    Feeling of Stress : Not at all  Social Connections: Socially Isolated (01/11/2022)   Social Connection and Isolation Panel [NHANES]    Frequency of Communication with Friends and Family: More than three times a week    Frequency of Social Gatherings with Friends and Family: More than three times a week    Attends Religious Services: Never    Database administrator or Organizations: No    Attends Banker Meetings: Never    Marital Status: Widowed  Intimate Partner Violence: Not At Risk (01/11/2022)   Humiliation, Afraid, Rape, and Kick questionnaire    Fear of Current or Ex-Partner: No    Emotionally Abused:  No    Physically Abused: No    Sexually Abused: No   Past Surgical History:  Procedure Laterality Date   LEFT HEART CATHETERIZATION WITH CORONARY ANGIOGRAM N/A 05/13/2013   Procedure: LEFT HEART CATHETERIZATION WITH CORONARY ANGIOGRAM;  Surgeon: Lesleigh Noe, MD;  Location: [facility] CATH LAB;  Service: Cardiovascular;  Laterality: N/A;   PERCUTANEOUS CORONARY STENT INTERVENTION (PCI-S)  05/13/2013   Procedure: PERCUTANEOUS CORONARY STENT INTERVENTION (PCI-S);  Surgeon: Lesleigh Noe, MD;  Location: [facility] CATH LAB;  Service: Cardiovascular;;   Family History  Problem Relation Age of Onset   Hyperlipidemia Mother    Cancer Father    Thyroid disease Sister    Hyperlipidemia Brother    Hyperlipidemia Son    Cancer Son        neuroendocrine cancer    Current Outpatient Medications:    alendronate (FOSAMAX) 70 MG tablet, Take 1 tablet (70 mg total) by mouth every 7 (seven) days. Take with a full glass of water on an empty stomach., Disp: 12 tablet, Rfl: 1   aspirin EC 81 MG EC tablet, Take 1 tablet (81 mg total) by mouth daily., Disp: , Rfl:    atorvastatin (LIPITOR) 40 MG tablet, Take 1 tablet (40 mg total) by mouth daily., Disp: 90 tablet, Rfl: 1   desloratadine (CLARINEX) 5 MG tablet, Take 1 tablet (5 mg total) by mouth daily. For cat allergy, Disp: 90 tablet, Rfl: 3   nitroGLYCERIN (NITROSTAT) 0.4 MG SL tablet, DISSOLVE ONE TABLET UNDER THE TONGUE EVERY 5 MINUTES AS NEEDED FOR CHEST PAIN.  DO NOT EXCEED A TOTAL OF 3 DOSES IN 15 MINUTES. Put on file, Disp: 25 tablet, Rfl: 1   albuterol (PROVENTIL) (2.5 MG/3ML) 0.083% nebulizer solution, Take 3 mLs (2.5 mg total) by nebulization every 6 (six) hours as needed for wheezing or shortness of breath. (Patient not taking: Reported on 08/31/2022), Disp: 75 mL, Rfl: 12   albuterol (VENTOLIN HFA) 108 (90 Base) MCG/ACT inhaler, Inhale 1-2 puffs into the lungs every 6 (six) hours as needed for wheezing or shortness of breath. (Patient not taking: Reported  on 08/31/2022), Disp: 1 each, Rfl: 0  No Known Allergies   ROS: Review of Systems A comprehensive review of systems was negative except for: Allergic/Immunologic: positive for cat allergy? Runny nose    Physical exam BP (!) 146/88   Pulse 76   Temp 98.6 F (37 C)   Ht 5\' 6"  (1.676 m)   Wt 113 lb 12.8 oz (51.6 kg)   SpO2 92%   BMI 18.37 kg/m  General appearance: alert, cooperative, appears stated age, and no distress Head: Normocephalic, without obvious abnormality, atraumatic Eyes: negative findings: lids and lashes normal  and conjunctivae and sclerae normal Ears: normal TM's and external ear canals both ears Nose: Nares normal. Septum midline. Mucosa normal. No drainage or sinus tenderness. Throat:  No oropharyngeal or sublingual masses.  Dentition fair Neck: no adenopathy, supple, symmetrical, trachea midline, and thyroid not enlarged, symmetric, no tenderness/mass/nodules Back:  Increased kyphosis of the thoracic spine appreciated Lungs:  Globally decreased breath sounds with no wheezes, rhonchi or rales.  She has normal work of breathing on room air Heart: regular rate and rhythm, S1, S2 normal, no murmur, click, rub or gallop Abdomen: soft, non-tender; bowel sounds normal; no masses,  no organomegaly Extremities: extremities normal, atraumatic, no cyanosis or edema Pulses: 2+ and symmetric Skin: Skin color, texture, turgor normal. No rashes or lesions Lymph nodes: Cervical, supraclavicular, and axillary nodes normal. Neurologic: Grossly normal      08/31/2022    1:04 PM 01/11/2022   10:56 AM 08/14/2021    3:18 PM  Depression screen PHQ 2/9  Decreased Interest 0 0 0  Down, Depressed, Hopeless 0 0 0  PHQ - 2 Score 0 0 0  Altered sleeping 0    Tired, decreased energy 0    Change in appetite 0    Feeling bad or failure about yourself  0    Trouble concentrating 0    Moving slowly or fidgety/restless 0    Suicidal thoughts 0    PHQ-9 Score 0    Difficult doing  work/chores Not difficult at all        08/31/2022    1:04 PM 10/20/2021    8:50 AM 08/14/2021    3:18 PM 02/24/2021   10:04 AM  GAD 7 : Generalized Anxiety Score  Nervous, Anxious, on Edge 0 -- 0 0  Control/stop worrying 0  0 0  Worry too much - different things 0  0 0  Trouble relaxing 0  0 0  Restless 0  0 0  Easily annoyed or irritable 0  0 0  Afraid - awful might happen 0  0 0  Total GAD 7 Score 0  0 0  Anxiety Difficulty Not difficult at all  Not difficult at all Not difficult at all     Assessment/ Plan: Sherald Hess Estrada here for annual physical exam.   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  We the discussed the risk versus benefits.  Check renal function, vitamin D level, calcium level  Continue current regimen as outlined by cardiology.  Check fasting lipid  Clarinex ordered for allergic rhinitis that is presumably due to her new kitten  Tetanus shot administered today.  She will set up mammogram  Counseled on healthy lifestyle choices, including diet (rich in fruits, vegetables and lean meats and low in salt and simple carbohydrates) and exercise (at least 30 minutes of moderate physical activity daily).  Patient to follow up 1 year  Tamecca Artiga M. Nadine Counts, DO

## 2022-09-24 ENCOUNTER — Other Ambulatory Visit: Payer: Self-pay | Admitting: Family Medicine

## 2022-09-24 DIAGNOSIS — Z1231 Encounter for screening mammogram for malignant neoplasm of breast: Secondary | ICD-10-CM

## 2022-09-30 ENCOUNTER — Other Ambulatory Visit: Payer: Self-pay | Admitting: *Deleted

## 2022-09-30 DIAGNOSIS — M810 Age-related osteoporosis without current pathological fracture: Secondary | ICD-10-CM

## 2022-10-01 MED ORDER — ALENDRONATE SODIUM 70 MG PO TABS: 70.0000 mg | ORAL_TABLET | ORAL | 3 refills | Status: DC

## 2022-10-06 ENCOUNTER — Other Ambulatory Visit: Payer: Self-pay | Admitting: Family Medicine

## 2022-10-06 DIAGNOSIS — I2510 Atherosclerotic heart disease of native coronary artery without angina pectoris: Secondary | ICD-10-CM

## 2022-10-06 DIAGNOSIS — E785 Hyperlipidemia, unspecified: Secondary | ICD-10-CM

## 2022-10-11 ENCOUNTER — Other Ambulatory Visit: Payer: Medicare HMO

## 2022-10-11 ENCOUNTER — Ambulatory Visit: Admission: RE | Admit: 2022-10-11 | Payer: Medicare HMO | Source: Ambulatory Visit

## 2022-10-11 DIAGNOSIS — M810 Age-related osteoporosis without current pathological fracture: Secondary | ICD-10-CM | POA: Diagnosis not present

## 2022-10-11 DIAGNOSIS — E785 Hyperlipidemia, unspecified: Secondary | ICD-10-CM

## 2022-10-11 DIAGNOSIS — I2510 Atherosclerotic heart disease of native coronary artery without angina pectoris: Secondary | ICD-10-CM | POA: Diagnosis not present

## 2022-10-12 LAB — CMP14+EGFR
ALT: 13 IU/L (ref 0–32)
AST: 16 IU/L (ref 0–40)
Albumin: 4.6 g/dL (ref 3.9–4.9)
Alkaline Phosphatase: 89 IU/L (ref 44–121)
BUN/Creatinine Ratio: 16 (ref 12–28)
BUN: 16 mg/dL (ref 8–27)
Bilirubin Total: 0.6 mg/dL (ref 0.0–1.2)
CO2: 24 mmol/L (ref 20–29)
Calcium: 9.6 mg/dL (ref 8.7–10.3)
Chloride: 103 mmol/L (ref 96–106)
Creatinine, Ser: 0.97 mg/dL (ref 0.57–1.00)
Globulin, Total: 2.4 g/dL (ref 1.5–4.5)
Glucose: 86 mg/dL (ref 70–99)
Potassium: 4.3 mmol/L (ref 3.5–5.2)
Sodium: 140 mmol/L (ref 134–144)
Total Protein: 7 g/dL (ref 6.0–8.5)
eGFR: 63 mL/min/{1.73_m2} (ref >59)

## 2022-10-12 LAB — LIPID PANEL
Chol/HDL Ratio: 2.8 ratio (ref 0.0–4.4)
Cholesterol, Total: 159 mg/dL (ref 100–199)
HDL: 57 mg/dL (ref >39)
LDL Chol Calc (NIH): 81 mg/dL (ref 0–99)
Triglycerides: 121 mg/dL (ref 0–149)
VLDL Cholesterol Cal: 21 mg/dL (ref 5–40)

## 2022-10-12 LAB — CBC
Hematocrit: 42.9 % (ref 34.0–46.6)
Hemoglobin: 14 g/dL (ref 11.1–15.9)
MCH: 30 pg (ref 26.6–33.0)
MCHC: 32.6 g/dL (ref 31.5–35.7)
MCV: 92 fL (ref 79–97)
Platelets: 188 10*3/uL (ref 150–450)
RBC: 4.66 x10E6/uL (ref 3.77–5.28)
RDW: 12.9 % (ref 11.7–15.4)
WBC: 7.1 10*3/uL (ref 3.4–10.8)

## 2022-10-12 LAB — VITAMIN D 25 HYDROXY (VIT D DEFICIENCY, FRACTURES): Vit D, 25-Hydroxy: 32.1 ng/mL (ref 30.0–100.0)

## 2022-10-12 LAB — TSH: TSH: 1.3 u[IU]/mL (ref 0.450–4.500)

## 2022-10-27 ENCOUNTER — Ambulatory Visit
Admission: RE | Admit: 2022-10-27 | Discharge: 2022-10-27 | Disposition: A | Discharge status: Home / Self Care | Payer: Medicare HMO | Source: Ambulatory Visit | Attending: Family Medicine

## 2022-10-27 DIAGNOSIS — Z1231 Encounter for screening mammogram for malignant neoplasm of breast: Secondary | ICD-10-CM

## 2022-11-01 ENCOUNTER — Other Ambulatory Visit: Payer: Self-pay | Admitting: Family Medicine

## 2022-11-01 DIAGNOSIS — R928 Other abnormal and inconclusive findings on diagnostic imaging of breast: Secondary | ICD-10-CM

## 2022-11-10 ENCOUNTER — Ambulatory Visit
Admission: RE | Admit: 2022-11-10 | Discharge: 2022-11-10 | Disposition: A | Discharge status: Home / Self Care | Payer: Medicare HMO | Source: Ambulatory Visit | Attending: Family Medicine

## 2022-11-10 DIAGNOSIS — R928 Other abnormal and inconclusive findings on diagnostic imaging of breast: Secondary | ICD-10-CM

## 2022-11-10 DIAGNOSIS — N6001 Solitary cyst of right breast: Secondary | ICD-10-CM | POA: Diagnosis not present

## 2023-01-20 ENCOUNTER — Ambulatory Visit: Payer: Medicare HMO

## 2023-01-20 VITALS — Ht 66.0 in | Wt 113.0 lb

## 2023-01-20 DIAGNOSIS — Z0000 Encounter for general adult medical examination without abnormal findings: Secondary | ICD-10-CM | POA: Diagnosis not present

## 2023-01-20 NOTE — Patient Instructions (Signed)
Kristin Trujillo , Thank you for taking time to come for your Medicare Wellness Visit. I appreciate your ongoing commitment to your health goals. Please review the following plan we discussed and let me know if I can assist you in the future.   Referrals/Orders/Follow-Ups/Clinician Recommendations: Aim for 30 minutes of exercise or brisk walking, 6-8 glasses of water, and 5 servings of fruits and vegetables each day.  This is a list of the screening recommended for you and due dates:  Health Maintenance  Topic Date Due   Zoster (Shingles) Vaccine (1 of 2) Never done   COVID-19 Vaccine (4 - 2024-25 season) 10/03/2022   Colon Cancer Screening  11/18/2022   Flu Shot  05/02/2023*   Screening for Lung Cancer  08/31/2023*   Pneumonia Vaccine (1 of 2 - PCV) 08/31/2023*   Medicare Annual Wellness Visit  01/20/2024   Mammogram  10/26/2024   DTaP/Tdap/Td vaccine (2 - Td or Tdap) 08/30/2032   DEXA scan (bone density measurement)  Completed   Hepatitis C Screening  Completed   HPV Vaccine  Aged Out  *Topic was postponed. The date shown is not the original due date.    Advanced directives: (ACP Link)Information on Advanced Care Planning can be found at [facility] of [place] Advance Health Care Directives Advance Health Care Directives ([email or web address]   Next Medicare Annual Wellness Visit scheduled for next year: Yes

## 2023-01-20 NOTE — Progress Notes (Signed)
Subjective:   Kristin Trujillo is a 72 y.o. female who presents for Medicare Annual (Subsequent) preventive examination.  Visit Complete: Virtual I connected with  Emylie A Barnick on 01/20/23 by a audio enabled telemedicine application and verified that I am speaking with the correct person using two identifiers.   Patient Location: Home  Provider Location: Office/Clinic  I discussed the limitations of evaluation and management by telemedicine. The patient expressed understanding and agreed to proceed.  Vital Signs: Because this visit was a virtual/telehealth visit, some criteria may be missing or patient reported. Any vitals not documented were not able to be obtained and vitals that have been documented are patient reported.  Cardiac Risk Factors include: advanced age (>76men, >47 women);dyslipidemia;hypertension     Objective:    Today's Vitals   01/20/23 0954  Weight: 113 lb (51.3 kg)  Height: 5\' 6"  (1.676 m)   Body mass index is 18.24 kg/m.     01/20/2023   10:03 AM 01/11/2022   11:01 AM 10/15/2021    9:06 AM 10/10/2021    9:03 AM 01/08/2021    1:48 PM 05/13/2013    8:00 PM  Advanced Directives  Does Patient Have a Medical Advance Directive? No No No Yes No Patient does not have advance directive;Patient would not like information  Does patient want to make changes to medical advance directive?   No - Patient declined No - Patient declined    Would patient like information on creating a medical advance directive? Yes (MAU/Ambulatory/Procedural Areas - Information given) No - Patient declined No - Patient declined  No - Patient declined   Pre-existing out of facility DNR order (yellow form or pink MOST form)      No    Current Medications (verified) Outpatient Encounter Medications as of 01/20/2023  Medication Sig   alendronate (FOSAMAX) 70 MG tablet Take 1 tablet (70 mg total) by mouth every 7 (seven) days. Take with a full glass of water on an empty stomach.   aspirin  EC 81 MG EC tablet Take 1 tablet (81 mg total) by mouth daily.   atorvastatin (LIPITOR) 40 MG tablet Take 1 tablet by mouth once daily   desloratadine (CLARINEX) 5 MG tablet Take 1 tablet (5 mg total) by mouth daily. For cat allergy   albuterol (PROVENTIL) (2.5 MG/3ML) 0.083% nebulizer solution Take 3 mLs (2.5 mg total) by nebulization every 6 (six) hours as needed for wheezing or shortness of breath. (Patient not taking: Reported on 01/20/2023)   albuterol (VENTOLIN HFA) 108 (90 Base) MCG/ACT inhaler Inhale 1-2 puffs into the lungs every 6 (six) hours as needed for wheezing or shortness of breath. (Patient not taking: Reported on 01/20/2023)   nitroGLYCERIN (NITROSTAT) 0.4 MG SL tablet DISSOLVE ONE TABLET UNDER THE TONGUE EVERY 5 MINUTES AS NEEDED FOR CHEST PAIN.  DO NOT EXCEED A TOTAL OF 3 DOSES IN 15 MINUTES. Put on file (Patient not taking: Reported on 01/20/2023)   No facility-administered encounter medications on file as of 01/20/2023.    Allergies (verified) Patient has no known allergies.   History: Past Medical History:  Diagnosis Date   Anxiety    CAD (coronary artery disease)    a. 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   Depression    HLD (hyperlipidemia)    HTN (hypertension)    Tobacco abuse    Past Surgical History:  Procedure Laterality Date   LEFT HEART CATHETERIZATION WITH CORONARY ANGIOGRAM N/A 05/13/2013   Procedure: LEFT HEART  CATHETERIZATION WITH CORONARY ANGIOGRAM;  Surgeon: Lesleigh Noe, MD;  Location: [facility] CATH LAB;  Service: Cardiovascular;  Laterality: N/A;   PERCUTANEOUS CORONARY STENT INTERVENTION (PCI-S)  05/13/2013   Procedure: PERCUTANEOUS CORONARY STENT INTERVENTION (PCI-S);  Surgeon: Lesleigh Noe, MD;  Location: [facility] CATH LAB;  Service: Cardiovascular;;   Family History  Problem Relation Age of Onset   Hyperlipidemia Mother    Cancer Father    Thyroid disease Sister    Hyperlipidemia Brother    Hyperlipidemia Son    Cancer Son         neuroendocrine cancer   Breast cancer Neg Hx    Social History   Socioeconomic History   Marital status: Widowed    Spouse name: Not on file   Number of children: 3   Years of education: Not on file   Highest education level: Not on file  Occupational History   Not on file  Tobacco Use   Smoking status: Former    Current packs/day: 0.00    Average packs/day: 0.5 packs/day for 50.0 years (25.0 ttl pk-yrs)    Types: Cigarettes    Start date: 11/02/1971    Quit date: 11/01/2021    Years since quitting: 1.2    Passive exposure: Past   Smokeless tobacco: Never   Tobacco comments:    1/2 ppd 10/23/21. Patient stopped smoking 11/01/2021  Vaping Use   Vaping status: Never Used  Substance and Sexual Activity   Alcohol use: No   Drug use: No   Sexual activity: Not Currently  Other Topics Concern   Not on file  Social History Narrative   Patient is widowed.  Her husband passed away 02/17/12 from colon cancer   Her middle son also passed away from some type of neuroendocrine cancer   She has 2 living sons.  Her eldest typically brings her to appointments.   She is an active every day smoker   She has dogs   She resides alone   Eats very healthy and prepares all meals   Social Drivers of Health   Financial Resource Strain: Low Risk  (01/20/2023)   Overall Financial Resource Strain (CARDIA)    Difficulty of Paying Living Expenses: Not hard at all  Food Insecurity: No Food Insecurity (01/20/2023)   Hunger Vital Sign    Worried About Running Out of Food in the Last Year: Never true    Ran Out of Food in the Last Year: Never true  Transportation Needs: No Transportation Needs (01/20/2023)   PRAPARE - Administrator, Civil Service (Medical): No    Lack of Transportation (Non-Medical): No  Physical Activity: Sufficiently Active (01/20/2023)   Exercise Vital Sign    Days of Exercise per Week: 5 days    Minutes of Exercise per Session: 30 min  Stress: No Stress Concern  Present (01/20/2023)   [employer] of Occupational Health - Occupational Stress Questionnaire    Feeling of Stress : Not at all  Social Connections: Socially Isolated (01/20/2023)   Social Connection and Isolation Panel [NHANES]    Frequency of Communication with Friends and Family: More than three times a week    Frequency of Social Gatherings with Friends and Family: Three times a week    Attends Religious Services: Never    Active Member of Clubs or Organizations: No    Attends Banker Meetings: Never    Marital Status: Widowed    Tobacco Counseling Counseling given: Not Answered  Tobacco comments: 1/2 ppd 10/23/21. Patient stopped smoking 11/01/2021   Clinical Intake:  Pre-visit preparation completed: Yes  Pain : No/denies pain     Diabetes: No  How often do you need to have someone help you when you read instructions, pamphlets, or other written materials from your doctor or pharmacy?: 1 - Never  Interpreter Needed?: No  Information entered by :: Kandis Fantasia LPN   Activities of Daily Living    01/20/2023   10:03 AM  In your present state of health, do you have any difficulty performing the following activities:  Hearing? 0  Vision? 0  Difficulty concentrating or making decisions? 0  Walking or climbing stairs? 0  Dressing or bathing? 0  Doing errands, shopping? 0  Preparing Food and eating ? N  Using the Toilet? N  In the past six months, have you accidently leaked urine? N  Do you have problems with loss of bowel control? N  Managing your Medications? N  Managing your Finances? N  Housekeeping or managing your Housekeeping? N    Patient Care Team: Raliegh Ip, DO as PCP - General (Family Medicine) Lyn Records, MD (Inactive) as PCP - Cardiology (Cardiology)  Indicate any recent Medical Services you may have received from other than [facility] providers in the past year (date may be approximate).     Assessment:   This is a  routine wellness examination for [place].  Hearing/Vision screen Hearing Screening - Comments:: Denies hearing difficulties   Vision Screening - Comments:: No vision problems; will schedule routine eye exam soon     Goals Addressed             This Visit's Progress    COMPLETED: Patient Stated       01/08/2021 AWV Goal: Fall Prevention  Over the next year, patient will decrease their risk for falls by: Using assistive devices, such as a cane or walker, as needed Identifying fall risks within their home and correcting them by: Removing throw rugs Adding handrails to stairs or ramps Removing clutter and keeping a clear pathway throughout the home Increasing light, especially at night Adding shower handles/bars Raising toilet seat Identifying potential personal risk factors for falls: Medication side effects Incontinence/urgency Vestibular dysfunction Hearing loss Musculoskeletal disorders Neurological disorders Orthostatic hypotension       COMPLETED: Patient Stated       01/11/2022 AWV Goal: Fall Prevention  Over the next year, patient will decrease their risk for falls by: Using assistive devices, such as a cane or walker, as needed Identifying fall risks within their home and correcting them by: Removing throw rugs Adding handrails to stairs or ramps Removing clutter and keeping a clear pathway throughout the home Increasing light, especially at night Adding shower handles/bars Raising toilet seat Identifying potential personal risk factors for falls: Medication side effects Incontinence/urgency Vestibular dysfunction Hearing loss Musculoskeletal disorders Neurological disorders Orthostatic hypotension       Remain active and independent        Depression Screen    01/20/2023   10:02 AM 08/31/2022    1:04 PM 01/11/2022   10:56 AM 10/20/2021    8:49 AM 08/14/2021    3:18 PM 07/10/2021   10:17 AM 03/02/2021   11:20 AM  PHQ 2/9 Scores  PHQ - 2 Score 0 0  0  0 0 0  PHQ- 9 Score  0       Exception Documentation    Patient refusal     Not completed  pt refusual       Fall Risk    01/20/2023   10:03 AM 08/31/2022    1:04 PM 01/11/2022   11:07 AM 10/20/2021    8:49 AM 08/14/2021    3:18 PM  Fall Risk   Falls in the past year? 0 0 0 0 0  Number falls in past yr: 0 0     Injury with Fall? 0 0     Risk for fall due to : No Fall Risks No Fall Risks     Follow up Falls prevention discussed;Education provided;Falls evaluation completed Education provided Falls evaluation completed      MEDICARE RISK AT HOME: Medicare Risk at Home Any stairs in or around the home?: No If so, are there any without handrails?: No Home free of loose throw rugs in walkways, pet beds, electrical cords, etc?: Yes Adequate lighting in your home to reduce risk of falls?: Yes Life alert?: No Use of a cane, walker or w/c?: No Grab bars in the bathroom?: Yes Shower chair or bench in shower?: No Elevated toilet seat or a handicapped toilet?: Yes  TIMED UP AND GO:  Was the test performed?  No    Cognitive Function:        01/20/2023   10:03 AM 01/11/2022   11:05 AM 01/08/2021    1:53 PM  6CIT Screen  What Year? 0 points 0 points 0 points  What month? 0 points 0 points 0 points  What time? 0 points 0 points 0 points  Count back from 20 0 points 0 points 0 points  Months in reverse 0 points 0 points 0 points  Repeat phrase 0 points 0 points 0 points  Total Score 0 points 0 points 0 points    Immunizations Immunization History  Administered Date(s) Administered   PFIZER Comirnaty(Gray Top)Covid-19 Tri-Sucrose Vaccine 04/20/2019, 05/11/2019, 02/10/2020   Tdap 08/31/2022    TDAP status: Up to date  Flu Vaccine status: Declined, Education has been provided regarding the importance of this vaccine but patient still declined. Advised may receive this vaccine at local pharmacy or Health Dept. Aware to provide a copy of the vaccination record if obtained  from local pharmacy or Health Dept. Verbalized acceptance and understanding.  Pneumococcal vaccine status: Declined,  Education has been provided regarding the importance of this vaccine but patient still declined. Advised may receive this vaccine at local pharmacy or Health Dept. Aware to provide a copy of the vaccination record if obtained from local pharmacy or Health Dept. Verbalized acceptance and understanding.   Covid-19 vaccine status: Declined, Education has been provided regarding the importance of this vaccine but patient still declined. Advised may receive this vaccine at local pharmacy or Health Dept.or vaccine clinic. Aware to provide a copy of the vaccination record if obtained from local pharmacy or Health Dept. Verbalized acceptance and understanding.  Qualifies for Shingles Vaccine? Yes   Zostavax completed No   Shingrix Completed?: No.    Education has been provided regarding the importance of this vaccine. Patient has been advised to call insurance company to determine out of pocket expense if they have not yet received this vaccine. Advised may also receive vaccine at local pharmacy or Health Dept. Verbalized acceptance and understanding.  Screening Tests Health Maintenance  Topic Date Due   Zoster Vaccines- Shingrix (1 of 2) Never done   COVID-19 Vaccine (4 - 2024-25 season) 10/03/2022   Colonoscopy  11/18/2022   INFLUENZA VACCINE  05/02/2023 (Originally 09/02/2022)  Lung Cancer Screening  08/31/2023 (Originally 07/07/2002)   Pneumonia Vaccine 75+ Years old (1 of 2 - PCV) 08/31/2023 (Originally [birth year]1958)   Medicare Annual Wellness (AWV)  01/20/2024   MAMMOGRAM  10/26/2024   DTaP/Tdap/Td (2 - Td or Tdap) 08/30/2032   DEXA SCAN  Completed   Hepatitis C Screening  Completed   HPV VACCINES  Aged Out    Health Maintenance  Health Maintenance Due  Topic Date Due   Zoster Vaccines- Shingrix (1 of 2) Never done   COVID-19 Vaccine (4 - 2024-25 season) 10/03/2022    Colonoscopy  11/18/2022    Colorectal cancer screening: Type of screening: Colonoscopy. Completed 11/17/12. Repeat every 10 years (Cologuard ordered and awaiting completion by patient)  Mammogram status: Completed 10/27/22. Repeat every year  Bone Density status: Completed 04/15/20. Results reflect: Bone density results: OSTEOPOROSIS. Repeat every 2 years.  Lung Cancer Screening: (Low Dose CT Chest recommended if Age 96-80 years, 20 pack-year currently smoking OR have quit w/in 15years.) does qualify.   Lung Cancer Screening Referral: declines at this time   Additional Screening:  Hepatitis C Screening: does qualify; Completed 03/26/19  Vision Screening: Recommended annual ophthalmology exams for early detection of glaucoma and other disorders of the eye. Is the patient up to date with their annual eye exam?  No  Who is the provider or what is the name of the office in which the patient attends annual eye exams? none If pt is not established with a provider, would they like to be referred to a provider to establish care? No .   Dental Screening: Recommended annual dental exams for proper oral hygiene  Community Resource Referral / Chronic Care Management: CRR required this visit?  No   CCM required this visit?  No     Plan:     I have personally reviewed and noted the following in the patient's chart:   Medical and social history Use of alcohol, tobacco or illicit drugs  Current medications and supplements including opioid prescriptions. Patient is not currently taking opioid prescriptions. Functional ability and status Nutritional status Physical activity Advanced directives List of other physicians Hospitalizations, surgeries, and ER visits in previous 12 months Vitals Screenings to include cognitive, depression, and falls Referrals and appointments  In addition, I have reviewed and discussed with patient certain preventive protocols, quality metrics, and best practice  recommendations. A written personalized care plan for preventive services as well as general preventive health recommendations were provided to patient.     Kandis Fantasia [place], [place]   16/11/9602   After Visit Summary: (MyChart) Due to this being a telephonic visit, the after visit summary with patients personalized plan was offered to patient via MyChart   Nurse Notes: No concerns at this time

## 2023-04-06 ENCOUNTER — Ambulatory Visit: Payer: Self-pay | Admitting: Family Medicine

## 2023-04-06 ENCOUNTER — Ambulatory Visit: Admitting: Family Medicine

## 2023-04-06 ENCOUNTER — Encounter: Payer: Self-pay | Admitting: Family Medicine

## 2023-04-06 VITALS — BP 152/88 | HR 92 | Temp 97.7°F | Ht 66.0 in | Wt 109.0 lb

## 2023-04-06 DIAGNOSIS — R300 Dysuria: Secondary | ICD-10-CM | POA: Diagnosis not present

## 2023-04-06 DIAGNOSIS — I10 Essential (primary) hypertension: Secondary | ICD-10-CM | POA: Diagnosis not present

## 2023-04-06 DIAGNOSIS — N3000 Acute cystitis without hematuria: Secondary | ICD-10-CM | POA: Diagnosis not present

## 2023-04-06 LAB — URINALYSIS, ROUTINE W REFLEX MICROSCOPIC
Bilirubin, UA: NEGATIVE
Glucose, UA: NEGATIVE
Ketones, UA: NEGATIVE
Nitrite, UA: NEGATIVE
Protein,UA: NEGATIVE
Specific Gravity, UA: 1.015 (ref 1.005–1.030)
Urobilinogen, Ur: 0.2 mg/dL (ref 0.2–1.0)
pH, UA: 6.5 (ref 5.0–7.5)

## 2023-04-06 LAB — MICROSCOPIC EXAMINATION

## 2023-04-06 MED ORDER — CEPHALEXIN 500 MG PO CAPS
500.0000 mg | ORAL_CAPSULE | Freq: Two times a day (BID) | ORAL | 0 refills | Status: AC
Start: 2023-04-06 — End: 2023-04-13

## 2023-04-06 NOTE — Telephone Encounter (Signed)
 Copied from CRM [id number]. Topic: Clinical - Medical Advice >> Apr 06, 2023  8:07 AM Everette C wrote: Reason for CRM: The patient has called to share that they've experienced urinary discomfort and concern for roughly 1 week   The patient shares that they're currently experiencing lower back discomfort and a burning sensation when urinating   The patient would like to be prescribed something for their discomfort   Please contact the patient further when available    Chief Complaint: Urinary Symptoms Symptoms: Dysuria, Frequency, Flank Pain, Concentrated Urine Frequency: Since Saturday Pertinent Negatives: Patient denies fever Disposition: [] ED /[] Urgent Care (no appt availability in office) / [x] Appointment(In office/virtual)/ []  [facility]/ [] Home Care/ [] Refused Recommended Disposition /[] [facility]/ []  Follow-up with PCP Additional Notes: LH is being triaged for urinary symptoms since Saturday that she believes is related to an urinary tract infection. The patient states she has had symptoms like these before and it was related to an urinary tract infection last time. Based on telephonic triage assessment, duration, severity, and presentation of symptoms, scheduled patient an in office appointment today.  Patient verbalized understanding and agreed to disposition.      Reason for Disposition  Side (flank) or lower back pain present  Answer Assessment - Initial Assessment Questions 1. SYMPTOM: "What's the main symptom you're concerned about?" (e.g., frequency, incontinence)     Dysuria, Frequency, Back Pain  2. ONSET: "When did the  symptoms  start?"     Since the weekend  3. PAIN: "Is there any pain?" If Yes, ask: "How bad is it?" (Scale: 1-10; mild, moderate, severe)     Mild to moderate  4. CAUSE: "What do you think is causing the symptoms?"     UTI  5. OTHER SYMPTOMS: "Do you have any other symptoms?" (e.g., blood in urine, fever, flank pain, pain  with urination)    Flank Pain, Odorous Urine  6. PREGNANCY: "Is there any chance you are pregnant?" "When was your last menstrual period?"     No and No  Protocols used: Urinary Symptoms-A-AH

## 2023-04-06 NOTE — Progress Notes (Signed)
   Acute Office Visit  Subjective:     Patient ID: Kristin Trujillo, female    DOB: 1950/08/15, 73 y.o.   MRN: [id number]  Chief Complaint  Patient presents with   Dysuria    Dysuria  This is a new problem. The current episode started in the past 7 days. The problem has been unchanged. The quality of the pain is described as burning. There has been no fever. Associated symptoms include frequency and urgency. Pertinent negatives include no chills, discharge, flank pain, hematuria, hesitancy, nausea or vomiting. She has tried nothing for the symptoms.     Review of Systems  Constitutional:  Negative for chills.  Gastrointestinal:  Negative for nausea and vomiting.  Genitourinary:  Positive for dysuria, frequency and urgency. Negative for flank pain, hematuria and hesitancy.        Objective:    BP (!) 152/88   Pulse 92   Temp 97.7 F (36.5 C) (Temporal)   Ht 5\' 6"  (1.676 m)   Wt 109 lb (49.4 kg)   SpO2 97%   BMI 17.59 kg/m    Physical Exam Vitals and nursing note reviewed.  Constitutional:      General: She is not in acute distress.    Appearance: She is not ill-appearing, toxic-appearing or diaphoretic.  Cardiovascular:     Rate and Rhythm: Normal rate and regular rhythm.     Heart sounds: Normal heart sounds. No murmur heard. Pulmonary:     Effort: Pulmonary effort is normal. No respiratory distress.     Breath sounds: Normal breath sounds. No wheezing, rhonchi or rales.  Abdominal:     General: Bowel sounds are normal. There is no distension.     Palpations: Abdomen is soft.     Tenderness: There is no abdominal tenderness. There is no right CVA tenderness, left CVA tenderness, guarding or rebound.  Neurological:     Mental Status: She is alert.  Psychiatric:        Behavior: Behavior normal.     Urine dipstick shows positive for RBC's and positive for leukocytes.  Micro exam: 11-30 WBC's per HPF, 0-2 RBC's per HPF, and few + bacteria.       Assessment &  Plan:   Kristin Trujillo was seen today for dysuria.  Diagnoses and all orders for this visit:  Acute cystitis without hematuria Keflex as below pending urine culture. Push fluids.  -     Urinalysis, Routine w reflex microscopic -     Urine Culture -     cephALEXin (KEFLEX) 500 MG capsule; Take 1 capsule (500 mg total) by mouth 2 (two) times daily for 7 days.  Primary hypertension BP elevated today. Monitor at home and notify for elevated home readings.    Return if symptoms worsen or fail to improve.  The patient indicates understanding of these issues and agrees with the plan.  Gabriel Earing, FNP

## 2023-04-09 LAB — URINE CULTURE

## 2023-04-11 ENCOUNTER — Encounter: Payer: Self-pay | Admitting: Family Medicine

## 2023-09-02 ENCOUNTER — Ambulatory Visit: Payer: Medicare HMO | Admitting: Family Medicine

## 2023-09-02 ENCOUNTER — Encounter: Payer: Self-pay | Admitting: Family Medicine

## 2023-09-02 VITALS — BP 148/75 | HR 65 | Temp 97.7°F | Ht 66.0 in | Wt 110.1 lb

## 2023-09-02 DIAGNOSIS — E785 Hyperlipidemia, unspecified: Secondary | ICD-10-CM

## 2023-09-02 DIAGNOSIS — J449 Chronic obstructive pulmonary disease, unspecified: Secondary | ICD-10-CM

## 2023-09-02 DIAGNOSIS — Z0000 Encounter for general adult medical examination without abnormal findings: Secondary | ICD-10-CM

## 2023-09-02 DIAGNOSIS — I2510 Atherosclerotic heart disease of native coronary artery without angina pectoris: Secondary | ICD-10-CM

## 2023-09-02 DIAGNOSIS — Z1211 Encounter for screening for malignant neoplasm of colon: Secondary | ICD-10-CM

## 2023-09-02 DIAGNOSIS — I10 Essential (primary) hypertension: Secondary | ICD-10-CM

## 2023-09-02 DIAGNOSIS — Z5320 Procedure and treatment not carried out because of patient's decision for unspecified reasons: Secondary | ICD-10-CM

## 2023-09-02 DIAGNOSIS — Z0001 Encounter for general adult medical examination with abnormal findings: Secondary | ICD-10-CM | POA: Diagnosis not present

## 2023-09-02 DIAGNOSIS — K1379 Other lesions of oral mucosa: Secondary | ICD-10-CM | POA: Diagnosis not present

## 2023-09-02 DIAGNOSIS — M810 Age-related osteoporosis without current pathological fracture: Secondary | ICD-10-CM | POA: Diagnosis not present

## 2023-09-02 LAB — LIPID PANEL

## 2023-09-02 MED ORDER — ALBUTEROL SULFATE HFA 108 (90 BASE) MCG/ACT IN AERS: 1.0000 | INHALATION_SPRAY | Freq: Four times a day (QID) | RESPIRATORY_TRACT | 0 refills | Status: AC | PRN

## 2023-09-02 MED ORDER — NITROGLYCERIN 0.4 MG SL SUBL: SUBLINGUAL_TABLET | SUBLINGUAL | 1 refills | Status: AC

## 2023-09-02 MED ORDER — ALENDRONATE SODIUM 70 MG PO TABS: 70.0000 mg | ORAL_TABLET | ORAL | 3 refills | Status: AC

## 2023-09-02 MED ORDER — ATORVASTATIN CALCIUM 40 MG PO TABS: 40.0000 mg | ORAL_TABLET | Freq: Every day | ORAL | 3 refills | Status: AC

## 2023-09-02 NOTE — Progress Notes (Signed)
 Kristin Trujillo is a 73 y.o. female presents to office today for annual physical exam examination.    Concerns today include: 1.  None she reports that she has been doing relatively well.  She is going to go kayak with her son tomorrow.  She reports no chest pain, shortness of breath, falls.  She is been working on posture and continues to stay physically active in her garden.  Occupation: working part time at [employer], Substance use: former smoker Health Maintenance Due  Topic Date Due   Pneumococcal Vaccine: 50+ Years (1 of 2 - PCV) Never done   Lung Cancer Screening  Never done   Zoster Vaccines- Shingrix (1 of 2) Never done   DEXA SCAN  04/16/2022   COVID-19 Vaccine (4 - 2024-25 season) 10/03/2022   Colonoscopy  11/18/2022   INFLUENZA VACCINE  09/02/2023   Refills needed today: All  Immunization History  Administered Date(s) Administered   PFIZER Comirnaty(Gray Top)Covid-19 Tri-Sucrose Vaccine 04/20/2019, 05/11/2019, 02/10/2020   Tdap 08/31/2022   Past Medical History:  Diagnosis Date   Anxiety    CAD (coronary artery disease)    a. 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   Depression    HLD (hyperlipidemia)    HTN (hypertension)    Stress and adjustment reaction 02/09/2018   Tobacco abuse    Social History   Socioeconomic History   Marital status: Widowed    Spouse name: Not on file   Number of children: 3   Years of education: Not on file   Highest education level: Not on file  Occupational History   Not on file  Tobacco Use   Smoking status: Former    Current packs/day: 0.00    Average packs/day: 0.5 packs/day for 50.0 years (25.0 ttl pk-yrs)    Types: Cigarettes    Start date: 11/02/1971    Quit date: 11/01/2021    Years since quitting: 1.8    Passive exposure: Past   Smokeless tobacco: Never   Tobacco comments:    1/2 ppd 10/23/21. Patient stopped smoking 11/01/2021  Vaping Use   Vaping status: Never Used  Substance and Sexual Activity   Alcohol  use: No   Drug use: No   Sexual activity: Not Currently  Other Topics Concern   Not on file  Social History Narrative   Patient is widowed.  Her husband passed away 09-27-2011 from colon cancer   Her middle son also passed away from some type of neuroendocrine cancer   She has 2 living sons.  Her eldest typically brings her to appointments.   She is an active every day smoker   She has dogs   She resides alone   Eats very healthy and prepares all meals   Social Drivers of Health   Financial Resource Strain: Low Risk  (01/20/2023)   Overall Financial Resource Strain (CARDIA)    Difficulty of Paying Living Expenses: Not hard at all  Food Insecurity: No Food Insecurity (01/20/2023)   Hunger Vital Sign    Worried About Running Out of Food in the Last Year: Never true    Ran Out of Food in the Last Year: Never true  Transportation Needs: No Transportation Needs (01/20/2023)   PRAPARE - Administrator, Civil Service (Medical): No    Lack of Transportation (Non-Medical): No  Physical Activity: Sufficiently Active (01/20/2023)   Exercise Vital Sign    Days of Exercise per Week: 5 days    Minutes  of Exercise per Session: 30 min  Stress: No Stress Concern Present (01/20/2023)   [employer] of Occupational Health - Occupational Stress Questionnaire    Feeling of Stress : Not at all  Social Connections: Socially Isolated (01/20/2023)   Social Connection and Isolation Panel    Frequency of Communication with Friends and Family: More than three times a week    Frequency of Social Gatherings with Friends and Family: Three times a week    Attends Religious Services: Never    Active Member of Clubs or Organizations: No    Attends Banker Meetings: Never    Marital Status: Widowed  Intimate Partner Violence: Not At Risk (01/20/2023)   Humiliation, Afraid, Rape, and Kick questionnaire    Fear of Current or Ex-Partner: No    Emotionally Abused: No    Physically  Abused: No    Sexually Abused: No   Past Surgical History:  Procedure Laterality Date   LEFT HEART CATHETERIZATION WITH CORONARY ANGIOGRAM N/A 05/13/2013   Procedure: LEFT HEART CATHETERIZATION WITH CORONARY ANGIOGRAM;  Surgeon: Victory LELON Claudene DOUGLAS, MD;  Location: [facility] CATH LAB;  Service: Cardiovascular;  Laterality: N/A;   PERCUTANEOUS CORONARY STENT INTERVENTION (PCI-S)  05/13/2013   Procedure: PERCUTANEOUS CORONARY STENT INTERVENTION (PCI-S);  Surgeon: Victory LELON Claudene DOUGLAS, MD;  Location: [facility] CATH LAB;  Service: Cardiovascular;;   Family History  Problem Relation Age of Onset   Hyperlipidemia Mother    Cancer Father    Thyroid  disease Sister    Hyperlipidemia Brother    Hyperlipidemia Son    Cancer Son        neuroendocrine cancer   Breast cancer Neg Hx     Current Outpatient Medications:    albuterol  (PROVENTIL ) (2.5 MG/3ML) 0.083% nebulizer solution, Take 3 mLs (2.5 mg total) by nebulization every 6 (six) hours as needed for wheezing or shortness of breath. (Patient not taking: Reported on 08/31/2022), Disp: 75 mL, Rfl: 12   albuterol  (VENTOLIN  HFA) 108 (90 Base) MCG/ACT inhaler, Inhale 1-2 puffs into the lungs every 6 (six) hours as needed for wheezing or shortness of breath. (Patient not taking: Reported on 08/31/2022), Disp: 1 each, Rfl: 0   alendronate  (FOSAMAX ) 70 MG tablet, Take 1 tablet (70 mg total) by mouth every 7 (seven) days. Take with a full glass of water on an empty stomach., Disp: 12 tablet, Rfl: 3   aspirin  EC 81 MG EC tablet, Take 1 tablet (81 mg total) by mouth daily., Disp: , Rfl:    atorvastatin  (LIPITOR) 40 MG tablet, Take 1 tablet by mouth once daily, Disp: 90 tablet, Rfl: 3   nitroGLYCERIN  (NITROSTAT ) 0.4 MG SL tablet, DISSOLVE ONE TABLET UNDER THE TONGUE EVERY 5 MINUTES AS NEEDED FOR CHEST PAIN.  DO NOT EXCEED A TOTAL OF 3 DOSES IN 15 MINUTES. Put on file, Disp: 25 tablet, Rfl: 1  No Known Allergies   ROS: Review of Systems Pertinent items noted in HPI and  remainder of comprehensive ROS otherwise negative.    Physical exam BP (!) 148/75   Pulse 65   Temp 97.7 F (36.5 C)   Ht 5' 6 (1.676 m)   Wt 110 lb 2 oz (50 kg)   SpO2 97%   BMI 17.77 kg/m  General appearance: alert, cooperative, appears stated age, and no distress Head: Normocephalic, without obvious abnormality, atraumatic Eyes: negative findings: lids and lashes normal, conjunctivae and sclerae normal, corneas clear, and pupils equal, round, reactive to light and accomodation Ears: normal TM's  and external ear canals both ears Nose: Nares normal. Septum midline. Mucosa normal. No drainage or sinus tenderness. Throat: She has a blue pigmented what appears to be mucocele on the left inner mucosal aspect of the mouth Neck: no adenopathy, no carotid bruit, supple, symmetrical, trachea midline, and thyroid  not enlarged, symmetric, no tenderness/mass/nodules Back: Increased thoracic kyphosis present with scoliotic curve to the left noted in the thoracic spine Lungs: Globally decreased breath sounds but no wheezes, rhonchi or rales.  She has normal work of breathing on room air Heart: regular rate and rhythm, S1, S2 normal, no murmur, click, rub or gallop Abdomen: soft, non-tender; bowel sounds normal; no masses,  no organomegaly Extremities: extremities normal, atraumatic, no cyanosis or edema Pulses: 2+ and symmetric Skin: Skin color, texture, turgor normal. No rashes or lesions Lymph nodes: Cervical, supraclavicular, and axillary nodes normal. Neurologic: Grossly normal      04/06/2023    9:33 AM 01/20/2023   10:02 AM 08/31/2022    1:04 PM  Depression screen PHQ 2/9  Decreased Interest 0 0 0  Down, Depressed, Hopeless 0 0 0  PHQ - 2 Score 0 0 0  Altered sleeping 0  0  Tired, decreased energy 0  0  Change in appetite 0  0  Feeling bad or failure about yourself  0  0  Trouble concentrating 0  0  Moving slowly or fidgety/restless 0  0  Suicidal thoughts 0  0  PHQ-9 Score 0  0   Difficult doing work/chores Not difficult at all  Not difficult at all      04/06/2023    9:33 AM 08/31/2022    1:04 PM 10/20/2021    8:50 AM 08/14/2021    3:18 PM  GAD 7 : Generalized Anxiety Score  Nervous, Anxious, on Edge 0 0 -- 0  Control/stop worrying 0 0  0  Worry too much - different things 0 0  0  Trouble relaxing 0 0  0  Restless 0 0  0  Easily annoyed or irritable 0 0  0  Afraid - awful might happen 0 0  0  Total GAD 7 Score 0 0  0  Anxiety Difficulty Not difficult at all Not difficult at all  Not difficult at all     Assessment/ Plan: Kristin Trujillo here for annual physical exam.   Annual physical exam  Primary hypertension - Plan: CMP14+EGFR  Coronary artery disease involving native coronary artery of native heart without angina pectoris - Plan: CMP14+EGFR, Lipid Panel, TSH, atorvastatin  (LIPITOR) 40 MG tablet, nitroGLYCERIN  (NITROSTAT ) 0.4 MG SL tablet  Hyperlipidemia with target LDL less than 70 - Plan: CMP14+EGFR, Lipid Panel, TSH, atorvastatin  (LIPITOR) 40 MG tablet  Age-related osteoporosis without current pathological fracture - Plan: DG [facility] DEXA, CMP14+EGFR, VITAMIN D  25 Hydroxy (Vit-D Deficiency, Fractures), alendronate  (FOSAMAX ) 70 MG tablet  Lung cancer screening declined by patient - Plan: CMP14+EGFR, CBC with Differential  COPD GOLD 2 - Plan: CMP14+EGFR, CBC with Differential  Screen for colon cancer - Plan: CMP14+EGFR  Mucocele of buccal mucosa  She declines lung cancer and colon cancer screening.  Declines vaccination.  Fasting labs were collected today.  She has known CAD and we make sure to renew her nitroglycerin  tablets which were out of date today.  Luckily, she has had no use for them.  Will get her set up for DEXA scan given known osteoporosis treated with alendronate .  Check vitamin D , calcium  and renal function  Breathing is stable with  as needed use of albuterol   I think that the lesion of concern is a mucocele on her left cheek.  We  discussed that be unusual for her to have a hematoma for this long.  I do recommend ongoing follow-up with oral surgery as previously planned  Counseled on healthy lifestyle choices, including diet (rich in fruits, vegetables and lean meats and low in salt and simple carbohydrates) and exercise (at least 30 minutes of moderate physical activity daily).  Patient to follow up 1 year for CPE  Kristin Koskela M. Jolinda, DO

## 2023-09-02 NOTE — Patient Instructions (Signed)
 Bone Density Test: What to Expect A bone density test uses a type of X-ray to measure the amount of calcium and other minerals in your bones. It can measure bone density in the hip and the spine. This test may also be called: Bone densitometry. Bone mineral density test. Dual-energy X-ray absorptiometry (DEXA). You may have this test to: Diagnose or screen for a condition that causes weak or thin bones, called osteoporosis. See what your risk is for a broken bone, also called a fracture. Check how well your treatment for weak or thin bones is working. The test is similar to having a regular X-ray. Tell a health care provider about: Any allergies you have. All medicines you're taking, including vitamins, herbs, eye drops, creams, and over-the-counter medicines. Any problems you or family members have had with anesthesia. Any bleeding problems you have. Any surgeries you've had. Any medical conditions you have. Whether you're pregnant or may be pregnant. Any medical tests you've had within the past 14 days that used contrast. What are the risks? Your health care provider will talk with you about risks. These may include: Being exposed to a small amount of radiation. This can slightly increase your cancer risk. What happens before the test? Do not take any calcium supplements within the 24 hours before your test. You'll need to take off: All metal jewelry. Eyeglasses. Removable dental appliances. Any other metal objects on your body. What happens during the test?  You'll lie down on an exam table. There will be an X-ray machine below you and an imaging device above you. Other devices, such as boxes or braces, may be used to position your body for the scan. The machine will slowly scan your body. You'll need to keep very still while the machine does the scan. The images will show up on a screen in the room. Images will be checked by a specialist after your test is finished. These  steps may vary. Ask what you can expect. What can I expect after the test? Ask when your results will be ready and how to get them. You may need to call or meet with your provider to get your results. This information is not intended to replace advice given to you by your health care provider. Make sure you discuss any questions you have with your health care provider. Document Revised: 05/30/2022 Document Reviewed: 05/30/2022 Elsevier Patient Education  2024 [employer].

## 2023-09-03 LAB — CMP14+EGFR
ALT: 14 IU/L (ref 0–32)
AST: 21 IU/L (ref 0–40)
Albumin: 4.7 g/dL (ref 3.8–4.8)
Alkaline Phosphatase: 80 IU/L (ref 44–121)
BUN/Creatinine Ratio: 20 (ref 12–28)
BUN: 18 mg/dL (ref 8–27)
Bilirubin Total: 0.4 mg/dL (ref 0.0–1.2)
CO2: 22 mmol/L (ref 20–29)
Calcium: 9.7 mg/dL (ref 8.7–10.3)
Chloride: 102 mmol/L (ref 96–106)
Creatinine, Ser: 0.88 mg/dL (ref 0.57–1.00)
Globulin, Total: 2.2 g/dL (ref 1.5–4.5)
Glucose: 87 mg/dL (ref 70–99)
Potassium: 4.1 mmol/L (ref 3.5–5.2)
Sodium: 140 mmol/L (ref 134–144)
Total Protein: 6.9 g/dL (ref 6.0–8.5)
eGFR: 70 mL/min/1.73 (ref >59)

## 2023-09-03 LAB — CBC WITH DIFFERENTIAL/PLATELET
Basophils Absolute: 0.1 x10E3/uL (ref 0.0–0.2)
Basos: 1 %
EOS (ABSOLUTE): 0.1 x10E3/uL (ref 0.0–0.4)
Eos: 1 %
Hematocrit: 43.4 % (ref 34.0–46.6)
Hemoglobin: 13.9 g/dL (ref 11.1–15.9)
Immature Grans (Abs): 0 x10E3/uL (ref 0.0–0.1)
Immature Granulocytes: 0 %
Lymphocytes Absolute: 1.9 x10E3/uL (ref 0.7–3.1)
Lymphs: 32 %
MCH: 31 pg (ref 26.6–33.0)
MCHC: 32 g/dL (ref 31.5–35.7)
MCV: 97 fL (ref 79–97)
Monocytes Absolute: 0.4 x10E3/uL (ref 0.1–0.9)
Monocytes: 7 %
Neutrophils Absolute: 3.5 x10E3/uL (ref 1.4–7.0)
Neutrophils: 59 %
Platelets: 185 x10E3/uL (ref 150–450)
RBC: 4.48 x10E6/uL (ref 3.77–5.28)
RDW: 12 % (ref 11.7–15.4)
WBC: 6 x10E3/uL (ref 3.4–10.8)

## 2023-09-03 LAB — VITAMIN D 25 HYDROXY (VIT D DEFICIENCY, FRACTURES): Vit D, 25-Hydroxy: 37.3 ng/mL (ref 30.0–100.0)

## 2023-09-03 LAB — LIPID PANEL
Chol/HDL Ratio: 2.7 ratio (ref 0.0–4.4)
Cholesterol, Total: 151 mg/dL (ref 100–199)
HDL: 55 mg/dL (ref >39)
LDL Chol Calc (NIH): 78 mg/dL (ref 0–99)
Triglycerides: 98 mg/dL (ref 0–149)
VLDL Cholesterol Cal: 18 mg/dL (ref 5–40)

## 2023-09-03 LAB — TSH: TSH: 1.05 u[IU]/mL (ref 0.450–4.500)

## 2023-09-05 ENCOUNTER — Ambulatory Visit: Payer: Self-pay | Admitting: Family Medicine

## 2023-09-30 ENCOUNTER — Ambulatory Visit (INDEPENDENT_AMBULATORY_CARE_PROVIDER_SITE_OTHER)

## 2023-09-30 DIAGNOSIS — M810 Age-related osteoporosis without current pathological fracture: Secondary | ICD-10-CM | POA: Diagnosis not present

## 2023-10-06 DIAGNOSIS — M810 Age-related osteoporosis without current pathological fracture: Secondary | ICD-10-CM | POA: Diagnosis not present

## 2023-10-06 DIAGNOSIS — Z780 Asymptomatic menopausal state: Secondary | ICD-10-CM | POA: Diagnosis not present

## 2023-10-31 ENCOUNTER — Encounter: Payer: Self-pay | Admitting: Family Medicine

## 2023-10-31 ENCOUNTER — Ambulatory Visit (INDEPENDENT_AMBULATORY_CARE_PROVIDER_SITE_OTHER): Admitting: Family Medicine

## 2023-10-31 VITALS — BP 141/78 | HR 77 | Temp 97.6°F | Ht 66.0 in | Wt 115.0 lb

## 2023-10-31 DIAGNOSIS — R399 Unspecified symptoms and signs involving the genitourinary system: Secondary | ICD-10-CM | POA: Diagnosis not present

## 2023-10-31 MED ORDER — CEPHALEXIN 500 MG PO CAPS
500.0000 mg | ORAL_CAPSULE | Freq: Four times a day (QID) | ORAL | 0 refills | Status: AC
Start: 2023-10-31 — End: ?

## 2023-10-31 NOTE — Progress Notes (Signed)
 BP (!) 141/78   Pulse 77   Temp 97.6 F (36.4 C)   Ht 5' 6 (1.676 m)   Wt 115 lb (52.2 kg)   SpO2 100%   BMI 18.56 kg/m    Subjective:   Patient ID: Kristin Trujillo, female    DOB: [birth year], 73 y.o.   MRN: [id number]  HPI: Kristin Trujillo is a 73 y.o. female presenting on 10/31/2023 for Urinary Tract Infection   Discussed the use of AI scribe software for clinical note transcription with the patient, who gave verbal consent to proceed.  History of Present Illness   Kristin Trujillo is a 73 year old female who presents with urinary symptoms suggestive of a urinary tract infection (UTI).  Urinary symptoms - Increased urinary frequency since the weekend - Nocturia, which is unusual for her - No hematuria - No vaginal symptoms, discharge, irritation, or bleeding  Back pain - Back pain onset over the weekend - Worsening since onset  Systemic symptoms - No fever - No chills - No body aches  Recurrent urinary tract infections - History of recurrent urinary tract infections - Attributes recurrences to either drinking too much fluid and delaying urination or not drinking enough fluid          Relevant past medical, surgical, family and social history reviewed and updated as indicated. Interim medical history since our last visit reviewed. Allergies and medications reviewed and updated.  Review of Systems  Constitutional:  Negative for chills and fever.  Eyes:  Negative for visual disturbance.  Respiratory:  Negative for chest tightness and shortness of breath.   Cardiovascular:  Negative for chest pain and leg swelling.  Gastrointestinal:  Negative for abdominal pain.  Genitourinary:  Positive for dysuria, frequency and urgency. Negative for difficulty urinating, flank pain, hematuria, vaginal bleeding, vaginal discharge and vaginal pain.  Musculoskeletal:  Negative for back pain and gait problem.  Skin:  Negative for rash.  Neurological:  Negative for  light-headedness and headaches.  Psychiatric/Behavioral:  Negative for agitation and behavioral problems.   All other systems reviewed and are negative.   Per HPI unless specifically indicated above   Allergies as of 10/31/2023   No Known Allergies      Medication List        Accurate as of October 31, 2023  2:58 PM. If you have any questions, ask your nurse or doctor.          albuterol  (2.5 MG/3ML) 0.083% nebulizer solution Commonly known as: PROVENTIL  Take 3 mLs (2.5 mg total) by nebulization every 6 (six) hours as needed for wheezing or shortness of breath.   albuterol  108 (90 Base) MCG/ACT inhaler Commonly known as: VENTOLIN  HFA Inhale 1-2 puffs into the lungs every 6 (six) hours as needed for wheezing or shortness of breath.   alendronate  70 MG tablet Commonly known as: FOSAMAX  Take 1 tablet (70 mg total) by mouth every 7 (seven) days. Take with a full glass of water on an empty stomach.   aspirin  EC 81 MG tablet Take 1 tablet (81 mg total) by mouth daily.   atorvastatin  40 MG tablet Commonly known as: LIPITOR Take 1 tablet (40 mg total) by mouth daily.   cephALEXin  500 MG capsule Commonly known as: KEFLEX  Take 1 capsule (500 mg total) by mouth 4 (four) times daily. Started by: Fonda DELENA Kempton Milne   nitroGLYCERIN  0.4 MG SL tablet Commonly known as: NITROSTAT  DISSOLVE ONE TABLET UNDER THE TONGUE EVERY 5 MINUTES AS  NEEDED FOR CHEST PAIN.  DO NOT EXCEED A TOTAL OF 3 DOSES IN 15 MINUTES. Put on file         Objective:   BP (!) 141/78   Pulse 77   Temp 97.6 F (36.4 C)   Ht 5' 6 (1.676 m)   Wt 115 lb (52.2 kg)   SpO2 100%   BMI 18.56 kg/m   Wt Readings from Last 3 Encounters:  10/31/23 115 lb (52.2 kg)  09/02/23 110 lb 2 oz (50 kg)  04/06/23 109 lb (49.4 kg)    Physical Exam Vitals and nursing note reviewed.  Cardiovascular:     Rate and Rhythm: Normal rate and regular rhythm.  Pulmonary:     Effort: Pulmonary effort is normal. No  respiratory distress.     Breath sounds: Normal breath sounds. No stridor. No wheezing, rhonchi or rales.  Chest:     Chest wall: No tenderness.  Abdominal:     General: Abdomen is flat. Bowel sounds are normal. There is no distension.     Palpations: Abdomen is soft.     Tenderness: There is no abdominal tenderness. There is no right CVA tenderness, left CVA tenderness, guarding or rebound.       CHEST: Lungs clear to auscultation bilaterally.       Urinalysis: 3+ blood and trace protein, otherwise negative  Assessment & Plan:   Problem List Items Addressed This Visit   None Visit Diagnoses       UTI symptoms    -  Primary   Relevant Medications   cephALEXin  (KEFLEX ) 500 MG capsule   Other Relevant Orders   Urinalysis   Urine Culture          Urinary tract infection Acute UTI suspected due to back pain, urinary frequency, and nocturia. Recurrent UTIs possibly linked to hydration habits. - Review urinalysis results to confirm diagnosis.     Urine results do show 3+ blood so does look like possible infection, will run culture still but sent Keflex  500 4 times daily for 7 days      Follow up plan: Return if symptoms worsen or fail to improve.  Counseling provided for all of the vaccine components Orders Placed This Encounter  Procedures   Urine Culture   Urinalysis    Fonda Levins, MD [facility] 10/31/2023, 2:58 PM

## 2023-11-01 LAB — URINALYSIS
Bilirubin, UA: NEGATIVE
Glucose, UA: NEGATIVE
Ketones, UA: NEGATIVE
Leukocytes,UA: NEGATIVE
Nitrite, UA: NEGATIVE
Specific Gravity, UA: >=1.03 — AB (ref 1.005–1.030)
Urobilinogen, Ur: 0.2 mg/dL (ref 0.2–1.0)
pH, UA: 5.5 (ref 5.0–7.5)

## 2023-11-02 ENCOUNTER — Ambulatory Visit: Payer: Self-pay | Admitting: Family Medicine

## 2023-11-02 LAB — URINE CULTURE: Organism ID, Bacteria: NO GROWTH

## 2024-01-24 ENCOUNTER — Ambulatory Visit: Payer: Self-pay

## 2024-01-24 VITALS — BP 141/78 | HR 77 | Ht 66.0 in | Wt 115.0 lb

## 2024-01-24 DIAGNOSIS — Z0000 Encounter for general adult medical examination without abnormal findings: Secondary | ICD-10-CM | POA: Diagnosis not present

## 2024-01-24 NOTE — Patient Instructions (Signed)
 Ms. Encalade,  Thank you for taking the time for your Medicare Wellness Visit. I appreciate your continued commitment to your health goals. Please review the care plan we discussed, and feel free to reach out if I can assist you further.  Please note that Annual Wellness Visits do not include a physical exam. Some assessments may be limited, especially if the visit was conducted virtually. If needed, we may recommend an in-person follow-up with your provider.  Ongoing Care Seeing your primary care provider every 3 to 6 months helps us  monitor your health and provide consistent, personalized care.   Referrals If a referral was made during today's visit and you haven't received any updates within two weeks, please contact the referred provider directly to check on the status.  Recommended Screenings:  Health Maintenance  Topic Date Due   Zoster (Shingles) Vaccine (1 of 2) Never done   Colon Cancer Screening  11/18/2022   Medicare Annual Wellness Visit  01/20/2024   Flu Shot  05/01/2024*   Screening for Lung Cancer  09/01/2024*   Pneumococcal Vaccine for age over 91 (1 of 2 - PCV) 09/01/2024*   COVID-19 Vaccine (4 - 2025-26 season) 11/18/2024*   Breast Cancer Screening  10/26/2024   Osteoporosis screening with Bone Density Scan  10/05/2025   DTaP/Tdap/Td vaccine (2 - Td or Tdap) 08/30/2032   Hepatitis C Screening  Completed   Meningitis B Vaccine  Aged Out  *Topic was postponed. The date shown is not the original due date.       01/24/2024    1:16 PM  Advanced Directives  Does Patient Have a Medical Advance Directive? No  Would patient like information on creating a medical advance directive? No - Patient declined    Vision: Annual vision screenings are recommended for early detection of glaucoma, cataracts, and diabetic retinopathy. These exams can also reveal signs of chronic conditions such as diabetes and high blood pressure.  Dental: Annual dental screenings help detect  early signs of oral cancer, gum disease, and other conditions linked to overall health, including heart disease and diabetes.  Please see the attached documents for additional preventive care recommendations.

## 2024-01-24 NOTE — Progress Notes (Signed)
 "  Chief Complaint  Patient presents with   Medicare Wellness     Subjective:   Kristin Trujillo is a 73 y.o. female who presents for a Medicare Annual Wellness Visit.  Visit info / Clinical Intake: Medicare Wellness Visit Type:: Subsequent Annual Wellness Visit Persons participating in visit and providing information:: patient Medicare Wellness Visit Mode:: Telephone If telephone:: video declined Since this visit was completed virtually, some vitals may be partially provided or unavailable. Missing vitals are due to the limitations of the virtual format.: Unable to obtain vitals - no equipment If Telephone or Video please confirm:: I connected with patient using audio/video enable telemedicine. I verified patient identity with two identifiers, discussed telehealth limitations, and patient agreed to proceed. Patient Location:: home Provider Location:: home office Interpreter Needed?: No Pre-visit prep was completed: yes AWV questionnaire completed by patient prior to visit?: no Living arrangements:: (!) lives alone Patient's Overall Health Status Rating: very good Typical amount of pain: none Does pain affect daily life?: no Are you currently prescribed opioids?: no  Dietary Habits and Nutritional Risks How many meals a day?: 3 Eats fruit and vegetables daily?: yes In the last 2 weeks, have you had any of the following?: none Diabetic:: no  Functional Status Activities of Daily Living (to include ambulation/medication): Independent Ambulation: Independent Medication Administration: Independent Home Management (perform basic housework or laundry): Independent Manage your own finances?: yes Primary transportation is: driving Concerns about vision?: no *vision screening is required for WTM* (last ov 11yrs ago--currently looking eye dr) Concerns about hearing?: (!) yes Uses hearing aids?: (!) yes  Fall Screening Falls in the past year?: 0 Number of falls in past year: 0 Was  there an injury with Fall?: 0 Fall Risk Category Calculator: 0 Patient Fall Risk Level: Low Fall Risk  Fall Risk Patient at Risk for Falls Due to: No Fall Risks Fall risk Follow up: Falls evaluation completed; Education provided  Home and Transportation Safety: All rugs have non-skid backing?: yes All stairs or steps have railings?: yes Grab bars in the bathtub or shower?: yes Have non-skid surface in bathtub or shower?: yes Good home lighting?: yes Regular seat belt use?: yes Hospital stays in the last year:: no  Cognitive Assessment Difficulty concentrating, remembering, or making decisions? : no Will 6CIT or Mini Cog be Completed: yes What year is it?: 0 points What month is it?: 0 points Give patient an address phrase to remember (5 components): [address] [place] About what time is it?: 0 points Count backwards from 20 to 1: 0 points Say the months of the year in reverse: 0 points  Advance Directives (For Healthcare) Does Patient Have a Medical Advance Directive?: No Would patient like information on creating a medical advance directive?: No - Patient declined  Reviewed/Updated  Reviewed/Updated: Reviewed All (Medical, Surgical, Family, Medications, Allergies, Care Teams, Patient Goals); Medical History; Surgical History; Family History; Medications; Allergies; Care Teams; Patient Goals    Allergies (verified) Patient has no known allergies.   Current Medications (verified) Outpatient Encounter Medications as of 01/24/2024  Medication Sig   albuterol  (PROVENTIL ) (2.5 MG/3ML) 0.083% nebulizer solution Take 3 mLs (2.5 mg total) by nebulization every 6 (six) hours as needed for wheezing or shortness of breath.   albuterol  (VENTOLIN  HFA) 108 (90 Base) MCG/ACT inhaler Inhale 1-2 puffs into the lungs every 6 (six) hours as needed for wheezing or shortness of breath.   alendronate  (FOSAMAX ) 70 MG tablet Take 1 tablet (70 mg total) by  mouth every 7 (seven) days.  Take with a full glass of water on an empty stomach.   aspirin  EC 81 MG EC tablet Take 1 tablet (81 mg total) by mouth daily.   atorvastatin  (LIPITOR) 40 MG tablet Take 1 tablet (40 mg total) by mouth daily.   cephALEXin  (KEFLEX ) 500 MG capsule Take 1 capsule (500 mg total) by mouth 4 (four) times daily.   nitroGLYCERIN  (NITROSTAT ) 0.4 MG SL tablet DISSOLVE ONE TABLET UNDER THE TONGUE EVERY 5 MINUTES AS NEEDED FOR CHEST PAIN.  DO NOT EXCEED A TOTAL OF 3 DOSES IN 15 MINUTES. Put on file   No facility-administered encounter medications on file as of 01/24/2024.    History: Past Medical History:  Diagnosis Date   Anxiety    CAD (coronary artery disease)    a. 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   Depression    HLD (hyperlipidemia)    HTN (hypertension)    Stress and adjustment reaction 02/09/2018   Tobacco abuse    Past Surgical History:  Procedure Laterality Date   LEFT HEART CATHETERIZATION WITH CORONARY ANGIOGRAM N/A 05/13/2013   Procedure: LEFT HEART CATHETERIZATION WITH CORONARY ANGIOGRAM;  Surgeon: Victory LELON Claudene DOUGLAS, MD;  Location: [facility] CATH LAB;  Service: Cardiovascular;  Laterality: N/A;   PERCUTANEOUS CORONARY STENT INTERVENTION (PCI-S)  05/13/2013   Procedure: PERCUTANEOUS CORONARY STENT INTERVENTION (PCI-S);  Surgeon: Victory LELON Claudene DOUGLAS, MD;  Location: [facility] CATH LAB;  Service: Cardiovascular;;   Family History  Problem Relation Age of Onset   Hyperlipidemia Mother    Cancer Father    Thyroid  disease Sister    Hyperlipidemia Brother    Hyperlipidemia Son    Cancer Son        neuroendocrine cancer   Breast cancer Neg Hx    Social History   Occupational History   Not on file  Tobacco Use   Smoking status: Former    Current packs/day: 0.00    Average packs/day: 0.5 packs/day for 50.0 years (25.0 ttl pk-yrs)    Types: Cigarettes    Start date: 11/02/1971    Quit date: 11/01/2021    Years since quitting: 2.2    Passive exposure: Past   Smokeless tobacco: Never   Tobacco  comments:    1/2 ppd 10/23/21. Patient stopped smoking 11/01/2021  Vaping Use   Vaping status: Never Used  Substance and Sexual Activity   Alcohol use: No   Drug use: No   Sexual activity: Not Currently   Tobacco Counseling Counseling given: Yes Tobacco comments: 1/2 ppd 10/23/21. Patient stopped smoking 11/01/2021  SDOH Screenings   Food Insecurity: No Food Insecurity (01/24/2024)  Housing: Unknown (01/24/2024)  Transportation Needs: No Transportation Needs (01/24/2024)  Utilities: Not At Risk (01/24/2024)  Alcohol Screen: Low Risk (01/20/2023)  Depression (PHQ2-9): Low Risk (01/24/2024)  Financial Resource Strain: Low Risk (01/20/2023)  Physical Activity: Sufficiently Active (01/24/2024)  Social Connections: Socially Isolated (01/24/2024)  Stress: No Stress Concern Present (01/24/2024)  Tobacco Use: Medium Risk (01/24/2024)  Health Literacy: Adequate Health Literacy (01/24/2024)   See flowsheets for full screening details  Depression Screen PHQ 2 & 9 Depression Scale- Over the past 2 weeks, how often have you been bothered by any of the following problems? Little interest or pleasure in doing things: 0 Feeling down, depressed, or hopeless (PHQ Adolescent also includes...irritable): 0 PHQ-2 Total Score: 0 Trouble falling or staying asleep, or sleeping too much: 0 Feeling tired or having little energy: 0 Poor appetite or overeating (  PHQ Adolescent also includes...weight loss): 0 Feeling bad about yourself - or that you are a failure or have let yourself or your family down: 0 Trouble concentrating on things, such as reading the newspaper or watching television (PHQ Adolescent also includes...like school work): 0 Moving or speaking so slowly that other people could have noticed. Or the opposite - being so fidgety or restless that you have been moving around a lot more than usual: 0 Thoughts that you would be better off dead, or of hurting yourself in some way: 0 PHQ-9 Total  Score: 0 If you checked off any problems, how difficult have these problems made it for you to do your work, take care of things at home, or get along with other people?: Not difficult at all     Goals Addressed             This Visit's Progress    Remain active and independent   On track            Objective:    Today's Vitals   01/24/24 1313  BP: (!) 141/78  Pulse: 77  Weight: 115 lb (52.2 kg)  Height: 5' 6 (1.676 m)   Body mass index is 18.56 kg/m.  Hearing/Vision screen No results found. Immunizations and Health Maintenance Health Maintenance  Topic Date Due   Zoster Vaccines- Shingrix (1 of 2) Never done   Colonoscopy  11/18/2022   Medicare Annual Wellness (AWV)  01/20/2024   Influenza Vaccine  05/01/2024 (Originally 09/02/2023)   Lung Cancer Screening  09/01/2024 (Originally 07/07/2002)   Pneumococcal Vaccine: 50+ Years (1 of 2 - PCV) 09/01/2024 (Originally 07/07/1969)   COVID-19 Vaccine (4 - 2025-26 season) 11/18/2024 (Originally 10/03/2023)   Mammogram  10/26/2024   Bone Density Scan  10/05/2025   DTaP/Tdap/Td (2 - Td or Tdap) 08/30/2032   Hepatitis C Screening  Completed   Meningococcal B Vaccine  Aged Out        Assessment/Plan:  This is a routine wellness examination for [place].  Patient Care Team: Jolinda Norene HERO, DO as PCP - General (Family Medicine) Claudene Victory ORN, MD (Inactive) as PCP - Cardiology (Cardiology)  I have personally reviewed and noted the following in the patients chart:   Medical and social history Use of alcohol, tobacco or illicit drugs  Current medications and supplements including opioid prescriptions. Functional ability and status Nutritional status Physical activity Advanced directives List of other physicians Hospitalizations, surgeries, and ER visits in previous 12 months Vitals Screenings to include cognitive, depression, and falls Referrals and appointments  No orders of the defined types were placed in this  encounter.  In addition, I have reviewed and discussed with patient certain preventive protocols, quality metrics, and best practice recommendations. A written personalized care plan for preventive services as well as general preventive health recommendations were provided to patient.   Ozie Ned, CMA   01/24/2024   No follow-ups on file.  After Visit Summary: (MyChart) Due to this being a telephonic visit, the after visit summary with patients personalized plan was offered to patient via MyChart   Nurse Notes: HM Addressed: Cologuard Ordered pt hasn't done it yet, has it at home. Pt declined shingles vaccines and Lung cancer screening as well.  "

## 2024-02-22 NOTE — Progress Notes (Signed)
 Kristin Trujillo                                          MRN: [id number]   02/22/2024   The [facility] Quality Team Specialist reviewed this patient medical record for the purposes of chart review for care gap closure. The following were reviewed: chart review for care gap closure-colorectal cancer screening.    [facility] Quality Team

## 2024-10-19 ENCOUNTER — Other Ambulatory Visit

## 2024-10-19 ENCOUNTER — Encounter: Admitting: Family Medicine
